# Patient Record
Sex: Female | Born: 1985 | Race: Black or African American | Hispanic: No | Marital: Single | Smoking: Former smoker
Health system: Southern US, Community
[De-identification: ages and names within clinical notes are randomized; demographics above are authoritative.]

## PROBLEM LIST (undated history)

## (undated) DIAGNOSIS — Z789 Other specified health status: Secondary | ICD-10-CM

## (undated) HISTORY — PX: HERNIA REPAIR: SHX51

---

## 2008-12-15 ENCOUNTER — Inpatient Hospital Stay (HOSPITAL_COMMUNITY): Admission: AD | Admit: 2008-12-15 | Discharge: 2008-12-15 | Payer: Self-pay | Admitting: Obstetrics & Gynecology

## 2009-08-09 ENCOUNTER — Inpatient Hospital Stay (HOSPITAL_COMMUNITY): Admission: AD | Admit: 2009-08-09 | Discharge: 2009-08-09 | Payer: Self-pay | Admitting: Obstetrics & Gynecology

## 2010-07-04 ENCOUNTER — Encounter: Payer: Self-pay | Admitting: Obstetrics and Gynecology

## 2010-08-31 LAB — CBC
Hemoglobin: 13.9 g/dL (ref 12.0–15.0)
MCHC: 33.3 g/dL (ref 30.0–36.0)
RBC: 4.28 MIL/uL (ref 3.87–5.11)

## 2010-08-31 LAB — WET PREP, GENITAL: Yeast Wet Prep HPF POC: NONE SEEN

## 2010-08-31 LAB — POCT PREGNANCY, URINE: Preg Test, Ur: NEGATIVE

## 2010-08-31 LAB — SAMPLE TO BLOOD BANK

## 2010-08-31 LAB — GC/CHLAMYDIA PROBE AMP, GENITAL
Chlamydia, DNA Probe: NEGATIVE
GC Probe Amp, Genital: NEGATIVE

## 2010-09-18 LAB — CBC
HCT: 39.9 % (ref 36.0–46.0)
Hemoglobin: 13.9 g/dL (ref 12.0–15.0)
MCHC: 34.8 g/dL (ref 30.0–36.0)
MCV: 96.6 fL (ref 78.0–100.0)
Platelets: 228 10*3/uL (ref 150–400)
RBC: 4.13 MIL/uL (ref 3.87–5.11)
RDW: 13.4 % (ref 11.5–15.5)
WBC: 7.9 10*3/uL (ref 4.0–10.5)

## 2010-09-18 LAB — ABO/RH: ABO/RH(D): B POS

## 2010-09-18 LAB — HCG, QUANTITATIVE, PREGNANCY: hCG, Beta Chain, Quant, S: 13 m[IU]/mL — ABNORMAL HIGH (ref <5)

## 2012-10-09 ENCOUNTER — Encounter (HOSPITAL_COMMUNITY): Payer: Self-pay | Admitting: Family Medicine

## 2012-10-09 ENCOUNTER — Emergency Department (HOSPITAL_COMMUNITY)
Admission: EM | Admit: 2012-10-09 | Discharge: 2012-10-09 | Disposition: A | Discharge status: Home / Self Care | Payer: Self-pay | Attending: Emergency Medicine | Admitting: Emergency Medicine

## 2012-10-09 DIAGNOSIS — N946 Dysmenorrhea, unspecified: Secondary | ICD-10-CM | POA: Insufficient documentation

## 2012-10-09 DIAGNOSIS — F17200 Nicotine dependence, unspecified, uncomplicated: Secondary | ICD-10-CM | POA: Insufficient documentation

## 2012-10-09 DIAGNOSIS — R1110 Vomiting, unspecified: Secondary | ICD-10-CM | POA: Insufficient documentation

## 2012-10-09 DIAGNOSIS — Z3202 Encounter for pregnancy test, result negative: Secondary | ICD-10-CM | POA: Insufficient documentation

## 2012-10-09 LAB — URINALYSIS, ROUTINE W REFLEX MICROSCOPIC
Bilirubin Urine: NEGATIVE
Glucose, UA: NEGATIVE mg/dL
Ketones, ur: NEGATIVE mg/dL
Protein, ur: NEGATIVE mg/dL
pH: 5.5 (ref 5.0–8.0)

## 2012-10-09 LAB — URINE MICROSCOPIC-ADD ON

## 2012-10-09 MED ORDER — HYDROMORPHONE HCL PF 2 MG/ML IJ SOLN
2.0000 mg | Freq: Once | INTRAMUSCULAR | Status: AC
  Administered 2012-10-09: 2 mg via INTRAMUSCULAR
  Filled 2012-10-09: qty 1

## 2012-10-09 MED ORDER — ONDANSETRON 4 MG PO TBDP
ORAL_TABLET | ORAL | Status: AC
  Administered 2012-10-09: 4 mg via ORAL
  Filled 2012-10-09: qty 1

## 2012-10-09 MED ORDER — HYDROCODONE-ACETAMINOPHEN 5-325 MG PO TABS: 2.0000 | ORAL_TABLET | ORAL | Status: DC | PRN

## 2012-10-09 MED ORDER — KETOROLAC TROMETHAMINE 30 MG/ML IJ SOLN
30.0000 mg | Freq: Once | INTRAMUSCULAR | Status: AC
  Administered 2012-10-09: 30 mg via INTRAMUSCULAR
  Filled 2012-10-09: qty 1

## 2012-10-09 MED ORDER — ONDANSETRON 4 MG PO TBDP: 4.0000 mg | ORAL_TABLET | Freq: Three times a day (TID) | ORAL | Status: DC | PRN

## 2012-10-09 MED ORDER — ONDANSETRON 4 MG PO TBDP
4.0000 mg | ORAL_TABLET | Freq: Once | ORAL | Status: AC
  Administered 2012-10-09: 4 mg via ORAL

## 2012-10-09 MED ORDER — IBUPROFEN 800 MG PO TABS: 800.0000 mg | ORAL_TABLET | Freq: Three times a day (TID) | ORAL | Status: DC

## 2012-10-09 MED ORDER — ONDANSETRON HCL 4 MG/2ML IJ SOLN: 4.0000 mg | Freq: Once | INTRAMUSCULAR | Status: DC

## 2012-10-09 NOTE — ED Provider Notes (Signed)
History     CSN: [id number]  Arrival date & time 10/09/12  8295   First MD Initiated Contact with Patient 10/09/12 0848      Chief Complaint  Patient presents with  . Menstrual Problem  . Abdominal Cramping    (Consider location/radiation/quality/duration/timing/severity/associated sxs/prior treatment) Patient is a 27 y.o. female presenting with cramps. The history is provided by the patient. No language interpreter was used.  Abdominal Cramping This is a new problem. The current episode started today. The problem occurs constantly. The problem has been unchanged. Associated symptoms include vomiting. Pertinent negatives include no abdominal pain. Nothing aggravates the symptoms. She has tried nothing for the symptoms. The treatment provided moderate relief.  Pt complains of severe menstrual cramps.  Pt used to see Dr. Neva Seat but she was unable to help symptoms.    History reviewed. No pertinent past medical history.  Past Surgical History  Procedure Laterality Date  . Hernia repair      History reviewed. No pertinent family history.  History  Substance Use Topics  . Smoking status: Current Every Day Smoker  . Smokeless tobacco: Not on file  . Alcohol Use: Yes    OB History   Grav Para Term Preterm Abortions TAB SAB Ect Mult Living                  Review of Systems  Gastrointestinal: Positive for vomiting. Negative for abdominal pain.  All other systems reviewed and are negative.    Allergies  Antihistamines, diphenhydramine-type  Home Medications  No current outpatient prescriptions on file.  BP 130/87  Pulse 61  Temp(Src) 97.8 F (36.6 C) (Oral)  Resp 18  SpO2 95%  LMP 10/09/2012  Physical Exam  Nursing note and vitals reviewed. Constitutional: She appears well-developed and well-nourished.  HENT:  Head: Normocephalic.  Cardiovascular: Normal rate.   Pulmonary/Chest: Effort normal.  Abdominal: Soft. There is no tenderness.  Musculoskeletal:  Normal range of motion.  Neurological: She is alert.  Skin: Skin is warm.    ED Course  Procedures (including critical care time)  Labs Reviewed - No data to display No results found.   No diagnosis found.    MDM  Pt given torodol, zofran and dilaudid,   Pt reports she feels better,   Pt given referal to [facility] hospital gyn clinic,   Pt givern rx for zofran, hydrocodone and ibuprofen.     Lonia Skinner [place], PA-C 10/09/12 1150

## 2012-10-09 NOTE — ED Notes (Signed)
Pt reports period began today, c/o lower back pain and abdominal cramping. Hx of same for years, but unable to control pain at home. Bleeding no heavier than usual. Reports nausea, vomiting as well.

## 2012-10-09 NOTE — ED Notes (Signed)
Per pt sts her cycle started this morning and she is having pelvic pain, cramps, chills, N.V. sts took tylenol for the pain.

## 2012-10-11 LAB — URINE CULTURE: Colony Count: >=100000

## 2012-10-11 NOTE — ED Provider Notes (Signed)
Medical screening examination/treatment/procedure(s) were performed by non-physician practitioner and as supervising physician I was immediately available for consultation/collaboration.   Gwyneth Sprout, MD 10/11/12 1401

## 2012-10-12 ENCOUNTER — Telehealth (HOSPITAL_COMMUNITY): Payer: Self-pay | Admitting: Emergency Medicine

## 2012-10-12 NOTE — ED Notes (Signed)
Patient has +Urine culture. °

## 2012-10-12 NOTE — ED Notes (Signed)
+   Urine Chart sent to EDP office for review. 

## 2012-10-13 ENCOUNTER — Telehealth (HOSPITAL_COMMUNITY): Payer: Self-pay | Admitting: Emergency Medicine

## 2012-10-13 NOTE — ED Notes (Signed)
Chart returned from EDP office. Per [employer] PA-C, Macrobid 100 mg PO BID x 5 days.

## 2012-10-13 NOTE — ED Notes (Signed)
Unable to contact patient via phone. Sent letter. °

## 2012-11-12 ENCOUNTER — Telehealth (HOSPITAL_COMMUNITY): Payer: Self-pay | Admitting: Emergency Medicine

## 2012-11-12 NOTE — ED Notes (Signed)
No response to letter sent after 30 days. Chart sent to Medical Records. °

## 2012-12-04 ENCOUNTER — Emergency Department (HOSPITAL_COMMUNITY)
Admission: EM | Admit: 2012-12-04 | Discharge: 2012-12-04 | Disposition: A | Discharge status: Home / Self Care | Payer: Self-pay | Attending: Emergency Medicine | Admitting: Emergency Medicine

## 2012-12-04 ENCOUNTER — Encounter (HOSPITAL_COMMUNITY): Payer: Self-pay | Admitting: Emergency Medicine

## 2012-12-04 DIAGNOSIS — Z3202 Encounter for pregnancy test, result negative: Secondary | ICD-10-CM | POA: Insufficient documentation

## 2012-12-04 DIAGNOSIS — N946 Dysmenorrhea, unspecified: Secondary | ICD-10-CM | POA: Insufficient documentation

## 2012-12-04 DIAGNOSIS — Z87891 Personal history of nicotine dependence: Secondary | ICD-10-CM | POA: Insufficient documentation

## 2012-12-04 LAB — URINE MICROSCOPIC-ADD ON

## 2012-12-04 LAB — URINALYSIS, ROUTINE W REFLEX MICROSCOPIC
Bilirubin Urine: NEGATIVE
Glucose, UA: 250 mg/dL — AB
Ketones, ur: 15 mg/dL — AB
Leukocytes, UA: NEGATIVE
Nitrite: POSITIVE — AB
Protein, ur: NEGATIVE mg/dL
Specific Gravity, Urine: 1.033 — ABNORMAL HIGH (ref 1.005–1.030)
Urobilinogen, UA: 0.2 mg/dL (ref 0.0–1.0)
pH: 6 (ref 5.0–8.0)

## 2012-12-04 LAB — PREGNANCY, URINE: Preg Test, Ur: NEGATIVE

## 2012-12-04 MED ORDER — ACETAMINOPHEN-CAFF-PYRILAMINE 500-60-15 MG PO TABS: 1.0000 | ORAL_TABLET | Freq: Three times a day (TID) | ORAL | Status: DC | PRN

## 2012-12-04 MED ORDER — PROMETHAZINE HCL 25 MG/ML IJ SOLN
25.0000 mg | Freq: Once | INTRAMUSCULAR | Status: AC
  Administered 2012-12-04: 25 mg via INTRAVENOUS
  Filled 2012-12-04: qty 1

## 2012-12-04 MED ORDER — MORPHINE SULFATE 4 MG/ML IJ SOLN
4.0000 mg | Freq: Once | INTRAMUSCULAR | Status: DC
  Filled 2012-12-04: qty 1

## 2012-12-04 MED ORDER — KETOROLAC TROMETHAMINE 30 MG/ML IJ SOLN
30.0000 mg | Freq: Once | INTRAMUSCULAR | Status: AC
  Administered 2012-12-04: 30 mg via INTRAVENOUS
  Filled 2012-12-04: qty 1

## 2012-12-04 NOTE — ED Notes (Signed)
Pt given warm pack to put on her abd to help with pain.

## 2012-12-04 NOTE — ED Notes (Signed)
Discharge and follow up instructions reviewed. Pt verbalized understanding.  

## 2012-12-04 NOTE — ED Notes (Signed)
Pt reports just starting her menstrual period this AM. States took 200mg  motrin at 0600. Tylenol 325mg  at 0500. Pt moaning in pain. Pt alert, oriented x4.

## 2012-12-04 NOTE — ED Notes (Signed)
Pt presents with a c/o abd pain. Pt states she usually gets bad cramps at the start of her period and nothing helps, she comes to the hospital. Pt states she saw her OBGYN last year and they put her on the depo shot to stop her period but she has been off of the shot for 4-21yrs due to not having insurance and weight gain. Pt rates pain 10/10.

## 2012-12-04 NOTE — ED Provider Notes (Signed)
History    CSN: [id number] Arrival date & time 12/04/12  7253  First MD Initiated Contact with Patient 12/04/12 1118     Chief Complaint  Patient presents with  . Abdominal Cramping   (Consider location/radiation/quality/duration/timing/severity/associated sxs/prior Treatment) HPI Sonya Nguyen is a 27 y.o.female with a significant PMH of dysmenorrhea presents to the ER with complaints of severe pain during menstrual cramping. She has been seen in the ER every 3rd period she says for the same thing. She did go to [facility] after her last evaluation and they recommend her to start taking the Depo Shot. She says that she wants to but is unable to afford it because it is $150 dollars and she does not have insurance. The patient is moaning in pain and has a warm pack on her suprapubic region. Tried 200mg  Ibuprofen at 5am this morning with Tylenol without relief.    History reviewed. No pertinent past medical history. Past Surgical History  Procedure Laterality Date  . Hernia repair     History reviewed. No pertinent family history. History  Substance Use Topics  . Smoking status: Former Smoker    Types: Cigarettes    Quit date: 11/10/2012  . Smokeless tobacco: Not on file  . Alcohol Use: Yes   OB History   Grav Para Term Preterm Abortions TAB SAB Ect Mult Living                 Review of Systems  Genitourinary: Positive for menstrual problem and pelvic pain.  All other systems reviewed and are negative.    Allergies  Antihistamines, diphenhydramine-type  Home Medications   Current Outpatient Rx  Name  Route  Sig  Dispense  Refill  . acetaminophen (TYLENOL) 325 MG tablet   Oral   Take 325-650 mg by mouth every 6 (six) hours as needed for pain.         Marland Kitchen ibuprofen (ADVIL,MOTRIN) 200 MG tablet   Oral   Take 200 mg by mouth every 6 (six) hours as needed for pain.         . Acetaminophen-Caff-Pyrilamine (MIDOL COMPLETE) 500-60-15 MG TABS   Oral   Take 1  tablet by mouth every 8 (eight) hours as needed.   20 each   0    BP 138/87  Pulse 73  Temp(Src) 97.7 F (36.5 C) (Oral)  Resp 16  SpO2 99%  LMP 12/04/2012 Physical Exam  Nursing note and vitals reviewed. Constitutional: She appears well-developed and well-nourished. No distress.  HENT:  Head: Normocephalic and atraumatic.  Eyes: Pupils are equal, round, and reactive to light.  Neck: Normal range of motion. Neck supple.  Cardiovascular: Normal rate and regular rhythm.   Pulmonary/Chest: Effort normal.  Abdominal: Soft. There is tenderness in the suprapubic area. There is no rigidity, no rebound, no guarding and negative Murphy's sign.  Neurological: She is alert.  Skin: Skin is warm and dry.    ED Course  Procedures (including critical care time) Labs Reviewed  URINALYSIS, ROUTINE W REFLEX MICROSCOPIC - Abnormal; Notable for the following:    APPearance HAZY (*)    Specific Gravity, Urine 1.033 (*)    Glucose, UA 250 (*)    Hgb urine dipstick LARGE (*)    Ketones, ur 15 (*)    Nitrite POSITIVE (*)    All other components within normal limits  URINE MICROSCOPIC-ADD ON - Abnormal; Notable for the following:    Bacteria, UA MANY (*)    All other  components within normal limits  URINE CULTURE  PREGNANCY, URINE   No results found. 1. Dysmenorrhea     MDM  Patients pain is an acute on chronic issue. Will treat with IV analgesics but will not give narcotic Rx for home. Urinalysis and urine preg pending.  Urine appears dirty, culture added on before treating. Pain relieved with Toradol and phenergan IV. Rx for midol given.  26 y.o.Sonya Nguyen's evaluation in the Emergency Department is complete. It has been determined that no acute conditions requiring further emergency intervention are present at this time. The patient/guardian have been advised of the diagnosis and plan. We have discussed signs and symptoms that warrant return to the ED, such as changes or worsening  in symptoms.  Vital signs are stable at discharge. Filed Vitals:   12/04/12 1226  BP: 138/87  Pulse: 73  Temp:   Resp: 16    Patient/guardian has voiced understanding and agreed to follow-up with the PCP or specialist.   Dorthula Matas, PA-C 12/04/12 1357

## 2012-12-04 NOTE — ED Provider Notes (Signed)
Medical screening examination/treatment/procedure(s) were performed by non-physician practitioner and as supervising physician I was immediately available for consultation/collaboration.   Glynn Octave, MD 12/04/12 2032

## 2012-12-06 LAB — URINE CULTURE: Colony Count: >=100000

## 2012-12-07 ENCOUNTER — Telehealth (HOSPITAL_COMMUNITY): Payer: Self-pay | Admitting: Emergency Medicine

## 2012-12-07 NOTE — ED Notes (Signed)
Post ED Visit - Positive Culture Follow-up: Successful Patient Follow-Up  Culture assessed and recommendations reviewed by: []  Wes Dulaney, Pharm.D., BCPS []  Celedonio Miyamoto, Pharm.D., BCPS []  Georgina Pillion, Pharm.D., BCPS []  [place], [address].D., BCPS, AAHIVP []  Estella Husk, Pharm.D., BCPS, AAHIVP [x]  Laurence Slate, [address].D., BCPS  Positive urine culture  [x]  Patient discharged without antimicrobial prescription and treatment is now indicated []  Organism is resistant to prescribed ED discharge antimicrobial []  Patient with positive blood cultures  Changes discussed with ED provider: Rhea Bleacher PA-C New antibiotic prescription: If symptomatic, Cephalexin 500 mg PO TID x 7 days    Kylie A Holland 12/07/2012, 2:19 PM

## 2012-12-07 NOTE — Progress Notes (Signed)
ED Antimicrobial Stewardship Positive Culture Follow Up   Sonya Nguyen is an 27 y.o. female who presented to [facility] on 12/04/2012 with a chief complaint of  Chief Complaint  Patient presents with  . Abdominal Cramping    Recent Results (from the past 720 hour(s))  URINE CULTURE     Status: None   Collection Time    12/04/12 12:59 PM      Result Value Range Status   Specimen Description URINE, CLEAN CATCH   Final   Special Requests NONE   Final   Culture  Setup Time 12/04/2012 21:09   Final   Colony Count >=100,000 COLONIES/ML   Final   Culture ESCHERICHIA COLI   Final   Report Status 12/06/2012 FINAL   Final   Organism ID, Bacteria ESCHERICHIA COLI   Final     [x]  Patient discharged originally without antimicrobial agent and treatment is now indicated  New antibiotic prescription: perform symptom check, if having symptoms of dysuria, use cephalexin 500mg  PO TID x 7 days  ED Provider: Rhea Bleacher, PA-C   Laurence Slate 12/07/2012, 12:40 PM Infectious Diseases Pharmacist Phone# [phone]

## 2012-12-08 ENCOUNTER — Telehealth (HOSPITAL_COMMUNITY): Payer: Self-pay | Admitting: Emergency Medicine

## 2013-01-13 NOTE — ED Notes (Signed)
No response after 30 days .Chart closed out and sent to Medical Records.  

## 2013-03-29 ENCOUNTER — Emergency Department (HOSPITAL_COMMUNITY)
Admission: EM | Admit: 2013-03-29 | Discharge: 2013-03-29 | Disposition: A | Discharge status: Home / Self Care | Payer: Self-pay | Attending: Emergency Medicine | Admitting: Emergency Medicine

## 2013-03-29 ENCOUNTER — Encounter (HOSPITAL_COMMUNITY): Payer: Self-pay | Admitting: Emergency Medicine

## 2013-03-29 DIAGNOSIS — R112 Nausea with vomiting, unspecified: Secondary | ICD-10-CM | POA: Insufficient documentation

## 2013-03-29 DIAGNOSIS — Z3202 Encounter for pregnancy test, result negative: Secondary | ICD-10-CM | POA: Insufficient documentation

## 2013-03-29 DIAGNOSIS — Z87891 Personal history of nicotine dependence: Secondary | ICD-10-CM | POA: Insufficient documentation

## 2013-03-29 DIAGNOSIS — N946 Dysmenorrhea, unspecified: Secondary | ICD-10-CM | POA: Insufficient documentation

## 2013-03-29 LAB — URINE MICROSCOPIC-ADD ON

## 2013-03-29 LAB — URINALYSIS, ROUTINE W REFLEX MICROSCOPIC
Glucose, UA: NEGATIVE mg/dL
Specific Gravity, Urine: 1.035 — ABNORMAL HIGH (ref 1.005–1.030)
Urobilinogen, UA: 0.2 mg/dL (ref 0.0–1.0)
pH: 5.5 (ref 5.0–8.0)

## 2013-03-29 MED ORDER — KETOROLAC TROMETHAMINE 30 MG/ML IJ SOLN
30.0000 mg | Freq: Once | INTRAMUSCULAR | Status: AC
  Administered 2013-03-29: 30 mg via INTRAVENOUS
  Filled 2013-03-29: qty 1

## 2013-03-29 MED ORDER — PROMETHAZINE HCL 25 MG/ML IJ SOLN
12.5000 mg | Freq: Once | INTRAMUSCULAR | Status: AC
  Administered 2013-03-29: 12.5 mg via INTRAVENOUS
  Filled 2013-03-29: qty 1

## 2013-03-29 MED ORDER — SODIUM CHLORIDE 0.9 % IV BOLUS (SEPSIS)
500.0000 mL | Freq: Once | INTRAVENOUS | Status: AC
  Administered 2013-03-29: 500 mL via INTRAVENOUS

## 2013-03-29 NOTE — ED Notes (Signed)
To ED via private vehicle-- with c/o severe abd cramping with vaginal bleeding (reg menstrual cycle). States has period cramps like this approx every 3-4 months. Relieved only when in shower, with hot water. Also has nausea and vomiting.

## 2013-03-29 NOTE — ED Provider Notes (Signed)
CSN: [id number]     Arrival date & time 03/29/13  9147 History   First MD Initiated Contact with Patient 03/29/13 [phone]     Chief Complaint  Patient presents with  . Abdominal Cramping   (Consider location/radiation/quality/duration/timing/severity/associated sxs/prior Treatment) Patient is a 27 y.o. female presenting with cramps. The history is provided by the patient.  Abdominal Cramping This is a recurrent problem. Associated symptoms include abdominal pain. Pertinent negatives include no chest pain, no headaches and no shortness of breath.   patient presents with abdominal pain and cramping with her period. She states she's been periods monthly but around every 3-4 months it is bad enough to come to the ER. She states she's had some heavy bleeding. It began this morning. She's had some nausea and vomiting home. She denies possibility of pregnancy. She states that she is trying to get the health department to give her the type of shot, however she has not been able to get there yet. No fevers. The pain is constant. No diarrhea or constipation. No dysuria.  History reviewed. No pertinent past medical history. Past Surgical History  Procedure Laterality Date  . Hernia repair     History reviewed. No pertinent family history. History  Substance Use Topics  . Smoking status: Former Smoker    Types: Cigarettes    Quit date: 11/10/2012  . Smokeless tobacco: Not on file  . Alcohol Use: Yes     Comment: occasionally   OB History   Grav Para Term Preterm Abortions TAB SAB Ect Mult Living                 Review of Systems  Constitutional: Negative for activity change and appetite change.  Eyes: Negative for pain.  Respiratory: Negative for chest tightness and shortness of breath.   Cardiovascular: Negative for chest pain and leg swelling.  Gastrointestinal: Positive for abdominal pain. Negative for nausea, vomiting and diarrhea.  Genitourinary: Positive for vaginal bleeding, menstrual  problem and pelvic pain. Negative for flank pain.  Musculoskeletal: Negative for back pain and neck stiffness.  Skin: Negative for rash.  Neurological: Negative for weakness, numbness and headaches.  Psychiatric/Behavioral: Negative for behavioral problems.    Allergies  Antihistamines, diphenhydramine-type  Home Medications  No current outpatient prescriptions on file. BP 110/56  Pulse 72  Temp(Src) 98.7 F (37.1 C) (Oral)  Resp 14  SpO2 98%  LMP 03/29/2013 Physical Exam  Nursing note and vitals reviewed. Constitutional: She is oriented to person, place, and time. She appears well-developed and well-nourished.  HENT:  Head: Normocephalic and atraumatic.  Eyes: EOM are normal. Pupils are equal, round, and reactive to light.  Neck: Normal range of motion. Neck supple.  Cardiovascular: Normal rate, regular rhythm and normal heart sounds.   No murmur heard. Pulmonary/Chest: Effort normal and breath sounds normal. No respiratory distress. She has no wheezes. She has no rales.  Abdominal: Soft. Bowel sounds are normal. She exhibits no distension. There is no tenderness. There is no rebound and no guarding.  Mild suprapubic tenderness without rebound or guarding. No masses.  Musculoskeletal: Normal range of motion.  Neurological: She is alert and oriented to person, place, and time. No cranial nerve deficit.  Skin: Skin is warm and dry.  Psychiatric: She has a normal mood and affect. Her speech is normal.    ED Course  Procedures (including critical care time) Labs Review Labs Reviewed  URINALYSIS, ROUTINE W REFLEX MICROSCOPIC - Abnormal; Notable for the following:  APPearance CLOUDY (*)    Specific Gravity, Urine 1.035 (*)    Hgb urine dipstick LARGE (*)    All other components within normal limits  PREGNANCY, URINE  URINE MICROSCOPIC-ADD ON   Imaging Review No results found.  EKG Interpretation   None       MDM   1. Dysmenorrhea    Patient presents with  dysmenorrhea. History of same. Feels better after treatment. She is not pregnant. She'll be discharged home    Juliet Rude. Rubin Payor, MD 03/29/13 1220

## 2013-03-29 NOTE — ED Notes (Signed)
Patient is resting comfortably. 

## 2013-03-29 NOTE — ED Notes (Signed)
Patient is still sleeping/resting

## 2013-05-16 ENCOUNTER — Emergency Department (HOSPITAL_COMMUNITY)
Admission: EM | Admit: 2013-05-16 | Discharge: 2013-05-16 | Discharge status: Against Medical Advice | Payer: Self-pay | Attending: Emergency Medicine | Admitting: Emergency Medicine

## 2013-05-16 ENCOUNTER — Encounter (HOSPITAL_COMMUNITY): Payer: Self-pay | Admitting: Emergency Medicine

## 2013-05-16 DIAGNOSIS — N898 Other specified noninflammatory disorders of vagina: Secondary | ICD-10-CM | POA: Insufficient documentation

## 2013-05-16 DIAGNOSIS — Z3202 Encounter for pregnancy test, result negative: Secondary | ICD-10-CM | POA: Insufficient documentation

## 2013-05-16 LAB — URINALYSIS, ROUTINE W REFLEX MICROSCOPIC
Bilirubin Urine: NEGATIVE
Ketones, ur: NEGATIVE mg/dL
Leukocytes, UA: NEGATIVE
Nitrite: NEGATIVE
Specific Gravity, Urine: 1.02 (ref 1.005–1.030)
Urobilinogen, UA: 0.2 mg/dL (ref 0.0–1.0)

## 2013-05-16 LAB — CBC
MCH: 33.2 pg (ref 26.0–34.0)
MCV: 96.7 fL (ref 78.0–100.0)
Platelets: 233 10*3/uL (ref 150–400)
RDW: 13.4 % (ref 11.5–15.5)
WBC: 11.9 10*3/uL — ABNORMAL HIGH (ref 4.0–10.5)

## 2013-05-16 NOTE — ED Notes (Addendum)
Pt c/o vaginal bleeding since 11/07.  States lower abdominal cramping since yesterday with some nausea.  Denies any other s/s.  States no sexual activity since 10/17 when she was dx with bacterial vaginosis.  Pt states this is not a new problem for her.

## 2013-05-16 NOTE — ED Notes (Signed)
No answer, unable to find, not in w/r, entryway, b/r, snack area, h/w or triage

## 2013-05-17 ENCOUNTER — Emergency Department (HOSPITAL_COMMUNITY)
Admission: EM | Admit: 2013-05-17 | Discharge: 2013-05-17 | Disposition: A | Discharge status: Home / Self Care | Payer: Self-pay | Attending: Emergency Medicine | Admitting: Emergency Medicine

## 2013-05-17 ENCOUNTER — Encounter (HOSPITAL_COMMUNITY): Payer: Self-pay | Admitting: Emergency Medicine

## 2013-05-17 DIAGNOSIS — Z87891 Personal history of nicotine dependence: Secondary | ICD-10-CM | POA: Insufficient documentation

## 2013-05-17 DIAGNOSIS — M549 Dorsalgia, unspecified: Secondary | ICD-10-CM | POA: Insufficient documentation

## 2013-05-17 DIAGNOSIS — N946 Dysmenorrhea, unspecified: Secondary | ICD-10-CM | POA: Insufficient documentation

## 2013-05-17 DIAGNOSIS — R112 Nausea with vomiting, unspecified: Secondary | ICD-10-CM | POA: Insufficient documentation

## 2013-05-17 LAB — COMPREHENSIVE METABOLIC PANEL
BUN: 10 mg/dL (ref 6–23)
CO2: 26 mEq/L (ref 19–32)
Calcium: 9.4 mg/dL (ref 8.4–10.5)
Creatinine, Ser: 0.79 mg/dL (ref 0.50–1.10)
GFR calc Af Amer: >90 mL/min (ref >90)
GFR calc non Af Amer: >90 mL/min (ref >90)
Glucose, Bld: 124 mg/dL — ABNORMAL HIGH (ref 70–99)

## 2013-05-17 LAB — CBC
Hemoglobin: 12.6 g/dL (ref 12.0–15.0)
MCH: 33.3 pg (ref 26.0–34.0)
MCV: 96.6 fL (ref 78.0–100.0)
RBC: 3.78 MIL/uL — ABNORMAL LOW (ref 3.87–5.11)

## 2013-05-17 MED ORDER — MEDROXYPROGESTERONE ACETATE 5 MG PO TABS: 10.0000 mg | ORAL_TABLET | Freq: Every day | ORAL | Status: DC

## 2013-05-17 MED ORDER — TRAMADOL HCL 50 MG PO TABS: 50.0000 mg | ORAL_TABLET | Freq: Four times a day (QID) | ORAL | Status: DC | PRN

## 2013-05-17 MED ORDER — KETOROLAC TROMETHAMINE 30 MG/ML IJ SOLN
30.0000 mg | Freq: Once | INTRAMUSCULAR | Status: AC
  Administered 2013-05-17: 30 mg via INTRAVENOUS
  Filled 2013-05-17: qty 1

## 2013-05-17 MED ORDER — PROMETHAZINE HCL 25 MG/ML IJ SOLN
12.5000 mg | Freq: Once | INTRAMUSCULAR | Status: AC
  Administered 2013-05-17: 12.5 mg via INTRAVENOUS
  Filled 2013-05-17: qty 1

## 2013-05-17 NOTE — ED Provider Notes (Signed)
CSN: [id number]     Arrival date & time 05/17/13  0409 History   First MD Initiated Contact with Patient 05/17/13 [phone]     Chief Complaint  Patient presents with  . Abdominal Pain   (Consider location/radiation/quality/duration/timing/severity/associated sxs/prior Treatment) HPI Comments: Patient with history of dysmenorrhea, presents with severe abdominal cramps consistent with menstrual cramps. Patient states that she has been bleeding since November 7. She states that she goes through a pad approximately every 3 hours. Pain is sharp and shooting and radiates to back. She has had occasional episodes of vomiting over the past 24 hours. She's been taking ibuprofen without relief. No fever, shortness of breath, syncope, lightheadedness with standing. She does feel more fatigued. The onset of this condition was acute. The course is constant. Aggravating factors: none. Alleviating factors: none.    Patient is a 27 y.o. female presenting with abdominal pain. The history is provided by the patient and medical records.  Abdominal Pain Associated symptoms: nausea, vaginal bleeding and vomiting   Associated symptoms: no chest pain, no cough, no diarrhea, no dysuria, no fever, no sore throat and no vaginal discharge     History reviewed. No pertinent past medical history. Past Surgical History  Procedure Laterality Date  . Hernia repair     No family history on file. History  Substance Use Topics  . Smoking status: Former Smoker    Types: Cigarettes    Quit date: 11/10/2012  . Smokeless tobacco: Not on file  . Alcohol Use: Yes     Comment: occasionally   OB History   Grav Para Term Preterm Abortions TAB SAB Ect Mult Living                 Review of Systems  Constitutional: Negative for fever.  HENT: Negative for rhinorrhea and sore throat.   Eyes: Negative for redness.  Respiratory: Negative for cough.   Cardiovascular: Negative for chest pain.  Gastrointestinal: Positive for  nausea, vomiting and abdominal pain. Negative for diarrhea.  Genitourinary: Positive for vaginal bleeding and menstrual problem. Negative for dysuria and vaginal discharge.  Musculoskeletal: Positive for back pain. Negative for myalgias.  Skin: Negative for rash.  Neurological: Negative for light-headedness and headaches.    Allergies  Antihistamines, diphenhydramine-type  Home Medications   Current Outpatient Rx  Name  Route  Sig  Dispense  Refill  . acetaminophen (TYLENOL) 500 MG tablet   Oral   Take 500 mg by mouth every 6 (six) hours as needed.         Marland Kitchen ibuprofen (ADVIL,MOTRIN) 200 MG tablet   Oral   Take 400 mg by mouth every 6 (six) hours as needed.          BP 140/97  Pulse 87  Temp(Src) 98.1 F (36.7 C) (Oral)  Resp 18  SpO2 99%  LMP 05/16/2013 Physical Exam  Nursing note and vitals reviewed. Constitutional: She appears well-developed and well-nourished.  HENT:  Head: Normocephalic and atraumatic.  Eyes: Conjunctivae are normal. Right eye exhibits no discharge. Left eye exhibits no discharge.  Neck: Normal range of motion. Neck supple.  Cardiovascular: Normal rate, regular rhythm and normal heart sounds.   Pulmonary/Chest: Effort normal and breath sounds normal. No respiratory distress. She has no wheezes. She has no rales.  Abdominal: Soft. There is tenderness (mild) in the right lower quadrant, suprapubic area and left lower quadrant.  Neurological: She is alert.  Skin: Skin is warm and dry.  Psychiatric: She has a normal mood  and affect.    ED Course  Procedures (including critical care time) Labs Review Labs Reviewed  CBC - Abnormal; Notable for the following:    RBC 3.78 (*)    All other components within normal limits  COMPREHENSIVE METABOLIC PANEL - Abnormal; Notable for the following:    Glucose, Bld 124 (*)    Total Bilirubin <0.1 (*)    All other components within normal limits   Imaging Review No results found.  EKG Interpretation    None      4:47 AM Patient seen and examined. Work-up initiated. Medications ordered.   Vital signs reviewed and are as follows: Filed Vitals:   05/17/13 0416  BP: 140/97  Pulse: 87  Temp: 98.1 F (36.7 C)  Resp: 18   Prior to discharge, patient reports feeling better. She was informed of results. No anemia.   Will d/c to home on course of provera for short-term bleeding control.   She is encouraged to f/u with her GYN or contact [facility] outpatient clinic for f/u.   If symptoms worsen again, she will need to go to [facility] for further eval.   Patient counseled on use of narcotic pain medications. Counseled not to combine these medications with others containing tylenol. Urged not to drink alcohol, drive, or perform any other activities that requires focus while taking these medications. The patient verbalizes understanding and agrees with the plan.  The patient was urged to return to the Emergency Department immediately with worsening of current symptoms, worsening abdominal pain, persistent vomiting, blood noted in stools, fever, or any other concerns. The patient verbalized understanding.    MDM   1. Dysmenorrhea    Patient with painful prolonged periods with heavy bleeding. No significant anemia on exam, labs. Pain controlled. Options for follow-up discussed. No emergent conditions suspected. Pain consistent with previous cramping. Do not suspect more significant pelvic etiology such as appendicitis, torsion, TOA/PID.    Renne Crigler, PA-C 05/18/13 1456

## 2013-05-17 NOTE — ED Notes (Signed)
Patient has hx of endometriosis. Patient began having lower mid abdominal pain that radiates to her backside. Patient describes pain as sharp shooting. x2 emesis episodes in 24 hours.

## 2013-05-19 NOTE — ED Provider Notes (Signed)
Medical screening examination/treatment/procedure(s) were performed by non-physician practitioner and as supervising physician I was immediately available for consultation/collaboration.    Sunnie Nielsen, MD 05/19/13 Moses Manners

## 2013-05-27 ENCOUNTER — Encounter (HOSPITAL_COMMUNITY): Payer: Self-pay | Admitting: Emergency Medicine

## 2013-05-27 ENCOUNTER — Emergency Department (HOSPITAL_COMMUNITY)
Admission: EM | Admit: 2013-05-27 | Discharge: 2013-05-27 | Disposition: A | Discharge status: Home / Self Care | Payer: Self-pay | Attending: Emergency Medicine | Admitting: Emergency Medicine

## 2013-05-27 DIAGNOSIS — N760 Acute vaginitis: Secondary | ICD-10-CM | POA: Insufficient documentation

## 2013-05-27 DIAGNOSIS — Z3202 Encounter for pregnancy test, result negative: Secondary | ICD-10-CM | POA: Insufficient documentation

## 2013-05-27 DIAGNOSIS — B9689 Other specified bacterial agents as the cause of diseases classified elsewhere: Secondary | ICD-10-CM | POA: Insufficient documentation

## 2013-05-27 DIAGNOSIS — A499 Bacterial infection, unspecified: Secondary | ICD-10-CM | POA: Insufficient documentation

## 2013-05-27 DIAGNOSIS — Z87891 Personal history of nicotine dependence: Secondary | ICD-10-CM | POA: Insufficient documentation

## 2013-05-27 DIAGNOSIS — Z202 Contact with and (suspected) exposure to infections with a predominantly sexual mode of transmission: Secondary | ICD-10-CM | POA: Insufficient documentation

## 2013-05-27 LAB — POCT PREGNANCY, URINE: Preg Test, Ur: NEGATIVE

## 2013-05-27 LAB — WET PREP, GENITAL: Yeast Wet Prep HPF POC: NONE SEEN

## 2013-05-27 LAB — URINALYSIS, ROUTINE W REFLEX MICROSCOPIC
Bilirubin Urine: NEGATIVE
Nitrite: NEGATIVE
Specific Gravity, Urine: 1.015 (ref 1.005–1.030)
Urobilinogen, UA: 0.2 mg/dL (ref 0.0–1.0)

## 2013-05-27 MED ORDER — AZITHROMYCIN 1 G PO PACK
1.0000 g | PACK | Freq: Once | ORAL | Status: AC
  Administered 2013-05-27: 1 g via ORAL
  Filled 2013-05-27: qty 1

## 2013-05-27 MED ORDER — METRONIDAZOLE 500 MG PO TABS: 500.0000 mg | ORAL_TABLET | Freq: Two times a day (BID) | ORAL | Status: DC

## 2013-05-27 MED ORDER — CEFTRIAXONE SODIUM 250 MG IJ SOLR
250.0000 mg | Freq: Once | INTRAMUSCULAR | Status: AC
  Administered 2013-05-27: 250 mg via INTRAMUSCULAR
  Filled 2013-05-27: qty 250

## 2013-05-27 MED ORDER — ONDANSETRON 4 MG PO TBDP
4.0000 mg | ORAL_TABLET | Freq: Once | ORAL | Status: AC
  Administered 2013-05-27: 4 mg via ORAL
  Filled 2013-05-27: qty 1

## 2013-05-27 NOTE — ED Notes (Addendum)
Explained patient importance of sustaining from sexual intercourse until results.

## 2013-05-27 NOTE — ED Provider Notes (Signed)
CSN: [id number]     Arrival date & time 05/27/13  1330 History   First MD Initiated Contact with Patient 05/27/13 1709     Chief Complaint  Patient presents with  . Exposure to STD  . Vaginal Discharge   (Consider location/radiation/quality/duration/timing/severity/associated sxs/prior Treatment) Patient is a 27 y.o. female presenting with STD exposure and vaginal discharge.  Exposure to STD Pertinent negatives include no abdominal pain, chest pain, chills, congestion, coughing, fever, headaches, nausea, neck pain, rash or vomiting.  Vaginal Discharge Associated symptoms: dysuria   Associated symptoms: no abdominal pain, no fever, no nausea and no vomiting    Sonya Nguyen is a 27 y.o. female who presents to the emergency department concerned that she may have a sexually transmitted disease.  She reports that approximately 6 weeks she had continuous vaginal for roughly one month.  Patient was evaluated here roughly 10 days ago and was given OCP prescription.  She did not fill this prescription but luckily the bleeding stopped.  Then over last two days she has noted some creamy white vaginal discharge that was associated with a bad odor.  Associated with some dysuria.  Then, today she found out that a committed boyfriend has been treated for gonorrhea/chlamydia.  At this point she was concerned and came in.  No abdominal pain.  No fevers.  No hematuria.  No other symptoms.  History reviewed. No pertinent past medical history. Past Surgical History  Procedure Laterality Date  . Hernia repair     History reviewed. No pertinent family history. History  Substance Use Topics  . Smoking status: Former Smoker    Types: Cigarettes    Quit date: 11/10/2012  . Smokeless tobacco: Not on file  . Alcohol Use: Yes     Comment: occasionally   OB History   Grav Para Term Preterm Abortions TAB SAB Ect Mult Living                 Review of Systems  Constitutional: Negative for fever and  chills.  HENT: Negative for congestion and rhinorrhea.   Respiratory: Negative for cough and shortness of breath.   Cardiovascular: Negative for chest pain.  Gastrointestinal: Negative for nausea, vomiting, abdominal pain, diarrhea and abdominal distention.  Endocrine: Negative for polyuria.  Genitourinary: Positive for dysuria and vaginal discharge.  Musculoskeletal: Negative for neck pain and neck stiffness.  Skin: Negative for rash.  Neurological: Negative for headaches.  Psychiatric/Behavioral: Negative.     Allergies  Antihistamines, diphenhydramine-type  Home Medications  No current outpatient prescriptions on file. BP 128/94  Pulse 72  Temp(Src) 98.9 F (37.2 C) (Oral)  Resp 19  Wt 110 lb (49.896 kg)  SpO2 99%  LMP 05/16/2013 Physical Exam  Nursing note and vitals reviewed. Constitutional: She is oriented to person, place, and time. She appears well-developed and well-nourished. No distress.  HENT:  Head: Normocephalic and atraumatic.  Right Ear: External ear normal.  Left Ear: External ear normal.  Nose: Nose normal.  Mouth/Throat: Oropharynx is clear and moist. No oropharyngeal exudate.  Eyes: EOM are normal. Pupils are equal, round, and reactive to light.  Neck: Normal range of motion. Neck supple. No tracheal deviation present.  Cardiovascular: Normal rate.   Pulmonary/Chest: Effort normal and breath sounds normal. No stridor. No respiratory distress. She has no wheezes. She has no rales.  Abdominal: Soft. She exhibits no distension. There is no tenderness. There is no rebound.  Genitourinary: There is no tenderness on the right labia. There is no  tenderness on the left labia. Right adnexum displays no tenderness and no fullness. Left adnexum displays no tenderness and no fullness. Vaginal discharge (yellow) found.  Musculoskeletal: Normal range of motion.  Neurological: She is alert and oriented to person, place, and time.  Skin: Skin is warm and dry. She is not  diaphoretic.    ED Course  Procedures (including critical care time) Labs Review Labs Reviewed  WET PREP, GENITAL  GC/CHLAMYDIA PROBE AMP  URINALYSIS, ROUTINE W REFLEX MICROSCOPIC  POCT PREGNANCY, URINE   Imaging Review No results found.  EKG Interpretation   None       MDM   1. Bacterial vaginosis   2. Exposure to sexually transmitted disease (STD)     Sonya Nguyen is a 27 y.o. female who presents to the ED with concern that she may have an STD.  Exam notable for some yellow vaginal discharge.  Wet prep with BV.  Patient desiring empiric antibiotic treatment for gonnorhea/chlamydia.  Rocephin/azithro given here.  Patient discharged on Flagyl.  All questions answered.  Patient discharged.    Sonya Koh, MD 05/28/13 [phone]

## 2013-05-27 NOTE — ED Notes (Signed)
Pt sts sexual partner being treated for gonorrhea and pt sts vaginal discharge with odor; pt sts LMP was 12/14

## 2013-05-27 NOTE — ED Notes (Signed)
Pt reports creamy vaginal discharge and pain with urination. Boyfriend recently dx and treated for gonorrhea. Pt wanting to be tested and treated.

## 2013-05-28 LAB — GC/CHLAMYDIA PROBE AMP: CT Probe RNA: NEGATIVE

## 2013-05-28 NOTE — ED Provider Notes (Signed)
Medical screening examination/treatment/procedure(s) were conducted as a shared visit with resident physician and myself.  I personally evaluated the patient during the encounter.  I interviewed and examined the patient. Lungs are CTAB. Cardiac exam wnl. Abdomen soft.  Will tx empirically for STD's at pt's request.   Junius Argyle, MD 05/28/13 [phone]

## 2013-05-29 ENCOUNTER — Telehealth (HOSPITAL_COMMUNITY): Payer: Self-pay | Admitting: *Deleted

## 2013-05-29 NOTE — ED Notes (Signed)
+  gonorrhea Patient treated with Rocephin And Zithromax-[facility] faxed 

## 2013-05-29 NOTE — ED Notes (Signed)
Patient informed of positive results after id'd x 2 and informed of need to notify partner to be treated. 

## 2014-07-18 ENCOUNTER — Emergency Department (HOSPITAL_COMMUNITY)
Admission: EM | Admit: 2014-07-18 | Discharge: 2014-07-18 | Disposition: A | Discharge status: Home / Self Care | Payer: Self-pay | Attending: Emergency Medicine | Admitting: Emergency Medicine

## 2014-07-18 ENCOUNTER — Encounter (HOSPITAL_COMMUNITY): Payer: Self-pay | Admitting: *Deleted

## 2014-07-18 DIAGNOSIS — Z3202 Encounter for pregnancy test, result negative: Secondary | ICD-10-CM | POA: Insufficient documentation

## 2014-07-18 DIAGNOSIS — Z87891 Personal history of nicotine dependence: Secondary | ICD-10-CM | POA: Insufficient documentation

## 2014-07-18 DIAGNOSIS — R102 Pelvic and perineal pain: Secondary | ICD-10-CM | POA: Insufficient documentation

## 2014-07-18 DIAGNOSIS — R112 Nausea with vomiting, unspecified: Secondary | ICD-10-CM | POA: Insufficient documentation

## 2014-07-18 LAB — CBC WITH DIFFERENTIAL/PLATELET
BASOS PCT: 0 % (ref 0–1)
Basophils Absolute: 0 10*3/uL (ref 0.0–0.1)
EOS ABS: 0.2 10*3/uL (ref 0.0–0.7)
EOS PCT: 1 % (ref 0–5)
HCT: 39.9 % (ref 36.0–46.0)
HEMOGLOBIN: 13.4 g/dL (ref 12.0–15.0)
Lymphocytes Relative: 13 % (ref 12–46)
Lymphs Abs: 2.4 10*3/uL (ref 0.7–4.0)
MCH: 32.4 pg (ref 26.0–34.0)
MCHC: 33.6 g/dL (ref 30.0–36.0)
MCV: 96.4 fL (ref 78.0–100.0)
MONOS PCT: 8 % (ref 3–12)
Monocytes Absolute: 1.5 10*3/uL — ABNORMAL HIGH (ref 0.1–1.0)
Neutro Abs: 14.7 10*3/uL — ABNORMAL HIGH (ref 1.7–7.7)
Neutrophils Relative %: 78 % — ABNORMAL HIGH (ref 43–77)
PLATELETS: 296 10*3/uL (ref 150–400)
RBC: 4.14 MIL/uL (ref 3.87–5.11)
RDW: 13.2 % (ref 11.5–15.5)
WBC: 18.9 10*3/uL — ABNORMAL HIGH (ref 4.0–10.5)

## 2014-07-18 LAB — URINALYSIS, ROUTINE W REFLEX MICROSCOPIC
BILIRUBIN URINE: NEGATIVE
GLUCOSE, UA: NEGATIVE mg/dL
KETONES UR: NEGATIVE mg/dL
LEUKOCYTES UA: NEGATIVE
Nitrite: NEGATIVE
PH: 5 (ref 5.0–8.0)
Protein, ur: NEGATIVE mg/dL
Specific Gravity, Urine: 1.022 (ref 1.005–1.030)
UROBILINOGEN UA: 0.2 mg/dL (ref 0.0–1.0)

## 2014-07-18 LAB — COMPREHENSIVE METABOLIC PANEL
ALK PHOS: 45 U/L (ref 39–117)
ALT: 26 U/L (ref 0–35)
ANION GAP: 6 (ref 5–15)
AST: 28 U/L (ref 0–37)
Albumin: 4 g/dL (ref 3.5–5.2)
BUN: 10 mg/dL (ref 6–23)
CO2: 28 mmol/L (ref 19–32)
Calcium: 9 mg/dL (ref 8.4–10.5)
Chloride: 106 mmol/L (ref 96–112)
Creatinine, Ser: 0.79 mg/dL (ref 0.50–1.10)
Glucose, Bld: 181 mg/dL — ABNORMAL HIGH (ref 70–99)
POTASSIUM: 3.4 mmol/L — AB (ref 3.5–5.1)
Sodium: 140 mmol/L (ref 135–145)
Total Bilirubin: 0.6 mg/dL (ref 0.3–1.2)
Total Protein: 6.6 g/dL (ref 6.0–8.3)

## 2014-07-18 LAB — WET PREP, GENITAL
Clue Cells Wet Prep HPF POC: NONE SEEN
Trich, Wet Prep: NONE SEEN
WBC, Wet Prep HPF POC: NONE SEEN
Yeast Wet Prep HPF POC: NONE SEEN

## 2014-07-18 LAB — LIPASE, BLOOD: LIPASE: 25 U/L (ref 11–59)

## 2014-07-18 LAB — URINE MICROSCOPIC-ADD ON

## 2014-07-18 LAB — PREGNANCY, URINE: PREG TEST UR: NEGATIVE

## 2014-07-18 MED ORDER — SODIUM CHLORIDE 0.9 % IV BOLUS (SEPSIS)
1000.0000 mL | Freq: Once | INTRAVENOUS | Status: AC
  Administered 2014-07-18: 1000 mL via INTRAVENOUS

## 2014-07-18 MED ORDER — ONDANSETRON HCL 4 MG/2ML IJ SOLN
4.0000 mg | Freq: Once | INTRAMUSCULAR | Status: AC
  Administered 2014-07-18: 4 mg via INTRAVENOUS
  Filled 2014-07-18: qty 2

## 2014-07-18 MED ORDER — HYDROMORPHONE HCL 1 MG/ML IJ SOLN
1.0000 mg | Freq: Once | INTRAMUSCULAR | Status: AC
  Administered 2014-07-18: 1 mg via INTRAVENOUS
  Filled 2014-07-18: qty 1

## 2014-07-18 MED ORDER — HYDROCODONE-ACETAMINOPHEN 5-325 MG PO TABS: 2.0000 | ORAL_TABLET | ORAL | Status: DC | PRN

## 2014-07-18 MED ORDER — ONDANSETRON 4 MG PO TBDP
8.0000 mg | ORAL_TABLET | Freq: Once | ORAL | Status: AC
  Administered 2014-07-18: 8 mg via ORAL
  Filled 2014-07-18: qty 2

## 2014-07-18 MED ORDER — OXYCODONE-ACETAMINOPHEN 5-325 MG PO TABS
1.0000 | ORAL_TABLET | Freq: Once | ORAL | Status: AC
  Administered 2014-07-18: 1 via ORAL
  Filled 2014-07-18: qty 1

## 2014-07-18 NOTE — ED Provider Notes (Signed)
CSN: [id number]     Arrival date & time 07/18/14  1615 History   First MD Initiated Contact with Patient 07/18/14 1759     Chief Complaint  Patient presents with  . Abdominal Pain     (Consider location/radiation/quality/duration/timing/severity/associated sxs/prior Treatment) HPI Comments: Patient presents to the ER for evaluation of lower abdominal pain with nausea and vomiting. Patient reports that she noticed lower abdominal and pelvic pain upon awakening this morning. Pain has intensified and has been constant. She reports that she has been experiencing nausea and vomiting with the symptoms. Patient states that she has had identical symptoms many times in the past with endometriosis.  Patient is a 29 y.o. female presenting with abdominal pain.  Abdominal Pain Associated symptoms: nausea and vomiting     History reviewed. No pertinent past medical history. Past Surgical History  Procedure Laterality Date  . Hernia repair     No family history on file. History  Substance Use Topics  . Smoking status: Former Smoker    Types: Cigarettes    Quit date: 11/10/2012  . Smokeless tobacco: Not on file  . Alcohol Use: Yes     Comment: occasionally   OB History    No data available     Review of Systems  Gastrointestinal: Positive for nausea, vomiting and abdominal pain.  Genitourinary: Positive for pelvic pain.  All other systems reviewed and are negative.     Allergies  Antihistamines, diphenhydramine-type  Home Medications   Prior to Admission medications   Medication Sig Start Date End Date Taking? Authorizing Provider  metroNIDAZOLE (FLAGYL) 500 MG tablet Take 1 tablet (500 mg total) by mouth 2 (two) times daily. One po bid x 7 days Patient not taking: Reported on 07/18/2014 05/27/13   Wiley Thuet, MD   BP 120/76 mmHg  Pulse 72  Temp(Src) 98 F (36.7 C) (Oral)  Resp 20  SpO2 99%  LMP 07/18/2014 Physical Exam  Constitutional: She is oriented to person, place,  and time. She appears well-developed and well-nourished. No distress.  HENT:  Head: Normocephalic and atraumatic.  Right Ear: Hearing normal.  Left Ear: Hearing normal.  Nose: Nose normal.  Mouth/Throat: Oropharynx is clear and moist and mucous membranes are normal.  Eyes: Conjunctivae and EOM are normal. Pupils are equal, round, and reactive to light.  Neck: Normal range of motion. Neck supple.  Cardiovascular: Regular rhythm, S1 normal and S2 normal.  Exam reveals no gallop and no friction rub.   No murmur heard. Pulmonary/Chest: Effort normal and breath sounds normal. No respiratory distress. She exhibits no tenderness.  Abdominal: Soft. Normal appearance and bowel sounds are normal. There is no hepatosplenomegaly. There is tenderness in the suprapubic area. There is no rebound, no guarding, no tenderness at McBurney's point and negative Murphy's sign. No hernia. Hernia confirmed negative in the right inguinal area and confirmed negative in the left inguinal area.  Genitourinary: Uterus normal. There is no rash, tenderness or lesion on the right labia. There is no rash, tenderness or lesion on the left labia. Cervix exhibits no motion tenderness and no discharge. Right adnexum displays no mass and no tenderness. Left adnexum displays no mass and no tenderness. No erythema in the vagina. No signs of injury around the vagina. No vaginal discharge found.  Musculoskeletal: Normal range of motion.  Lymphadenopathy:       Right: No inguinal adenopathy present.       Left: No inguinal adenopathy present.  Neurological: She is alert and oriented  to person, place, and time. She has normal strength. No cranial nerve deficit or sensory deficit. Coordination normal. GCS eye subscore is 4. GCS verbal subscore is 5. GCS motor subscore is 6.  Skin: Skin is warm, dry and intact. No rash noted. No cyanosis.  Psychiatric: She has a normal mood and affect. Her speech is normal and behavior is normal. Thought  content normal.  Nursing note and vitals reviewed.   ED Course  Procedures (including critical care time) Labs Review Labs Reviewed  CBC WITH DIFFERENTIAL/PLATELET - Abnormal; Notable for the following:    WBC 18.9 (*)    Neutrophils Relative % 78 (*)    Neutro Abs 14.7 (*)    Monocytes Absolute 1.5 (*)    All other components within normal limits  COMPREHENSIVE METABOLIC PANEL - Abnormal; Notable for the following:    Potassium 3.4 (*)    Glucose, Bld 181 (*)    All other components within normal limits  URINALYSIS, ROUTINE W REFLEX MICROSCOPIC - Abnormal; Notable for the following:    Hgb urine dipstick LARGE (*)    All other components within normal limits  WET PREP, GENITAL  LIPASE, BLOOD  PREGNANCY, URINE  URINE MICROSCOPIC-ADD ON  RPR  HIV ANTIBODY (ROUTINE TESTING)  GC/CHLAMYDIA PROBE AMP ([place])    Imaging Review No results found.   EKG Interpretation None      MDM   Final diagnoses:  Pelvic pain in female    Patient presents to the ER for evaluation of abdominal and pelvic pain. Pain is mainly in the low pelvis, central region. Patient reports that she has had similar symptoms in the past with endometriosis. She did start menstruating. Abdominal exam was otherwise benign. Lab work was normal except for elevated white blood cell count. This is nonspecific. No concern for bacterial infection. No concern for PID based on pelvic exam. Patient does not require CT scan or ultrasound at this point, as exam is benign. She has had significant relief with fluids and pain medication. Patient will be discharged with analgesia, follow up with OB/GYN.    Orpah Greek, MD 07/18/14 [phone]

## 2014-07-18 NOTE — Discharge Instructions (Signed)
Pelvic Pain Female pelvic pain can be caused by many different things and start from a variety of places. Pelvic pain refers to pain that is located in the lower half of the abdomen and between your hips. The pain may occur over a short period of time (acute) or may be reoccurring (chronic). The cause of pelvic pain may be related to disorders affecting the female reproductive organs (gynecologic), but it may also be related to the bladder, kidney stones, an intestinal complication, or muscle or skeletal problems. Getting help right away for pelvic pain is important, especially if there has been severe, sharp, or a sudden onset of unusual pain. It is also important to get help right away because some types of pelvic pain can be life threatening.  CAUSES  Below are only some of the causes of pelvic pain. The causes of pelvic pain can be in one of several categories.   Gynecologic.  Pelvic inflammatory disease.  Sexually transmitted infection.  Ovarian cyst or a twisted ovarian ligament (ovarian torsion).  Uterine lining that grows outside the uterus (endometriosis).  Fibroids, cysts, or tumors.  Ovulation.  Pregnancy.  Pregnancy that occurs outside the uterus (ectopic pregnancy).  Miscarriage.  Labor.  Abruption of the placenta or ruptured uterus.  Infection.  Uterine infection (endometritis).  Bladder infection.  Diverticulitis.  Miscarriage related to a uterine infection (septic abortion).  Bladder.  Inflammation of the bladder (cystitis).  Kidney stone(s).  Gastrointestinal.  Constipation.  Diverticulitis.  Neurologic.  Trauma.  Feeling pelvic pain because of mental or emotional causes (psychosomatic).  Cancers of the bowel or pelvis. EVALUATION  Your caregiver will want to take a careful history of your concerns. This includes recent changes in your health, a careful gynecologic history of your periods (menses), and a sexual history. Obtaining your family  history and medical history is also important. Your caregiver may suggest a pelvic exam. A pelvic exam will help identify the location and severity of the pain. It also helps in the evaluation of which organ system may be involved. In order to identify the cause of the pelvic pain and be properly treated, your caregiver may order tests. These tests may include:   A pregnancy test.  Pelvic ultrasonography.  An X-ray exam of the abdomen.  A urinalysis or evaluation of vaginal discharge.  Blood tests. HOME CARE INSTRUCTIONS   Only take over-the-counter or prescription medicines for pain, discomfort, or fever as directed by your caregiver.   Rest as directed by your caregiver.   Eat a balanced diet.   Drink enough fluids to make your urine clear or pale yellow, or as directed.   Avoid sexual intercourse if it causes pain.   Apply warm or cold compresses to the lower abdomen depending on which one helps the pain.   Avoid stressful situations.   Keep a journal of your pelvic pain. Write down when it started, where the pain is located, and if there are things that seem to be associated with the pain, such as food or your menstrual cycle.  Follow up with your caregiver as directed.  SEEK MEDICAL CARE IF:  Your medicine does not help your pain.  You have abnormal vaginal discharge. SEEK IMMEDIATE MEDICAL CARE IF:   You have heavy bleeding from the vagina.   Your pelvic pain increases.   You feel light-headed or faint.   You have chills.   You have pain with urination or blood in your urine.   You have uncontrolled diarrhea   or vomiting.   You have a fever or persistent symptoms for more than 3 days.  You have a fever and your symptoms suddenly get worse.   You are being physically or sexually abused.  MAKE SURE YOU:  Understand these instructions.  Will watch your condition.  Will get help if you are not doing well or get worse. Document Released:  04/25/2004 Document Revised: 10/13/2013 Document Reviewed: 09/18/2011 Exit[employer] Patient Information 2015 [employer]. This information is not intended to replace advice given to you by your health care provider. Make sure you discuss any questions you have with your health care provider.  

## 2014-07-18 NOTE — ED Notes (Signed)
The pt reports that she cannot void at present

## 2014-07-18 NOTE — ED Notes (Signed)
The pt is c/o abd pain today  When her period started.  She has this each period and she reports that she comes to the ed for morphine  lmp now

## 2014-07-18 NOTE — ED Notes (Signed)
The pt is not making any tears with her crying.

## 2014-07-18 NOTE — ED Notes (Signed)
Pt crying in triage

## 2014-07-20 LAB — GC/CHLAMYDIA PROBE AMP (~~LOC~~) NOT AT ARMC
Chlamydia: NEGATIVE
Neisseria Gonorrhea: NEGATIVE

## 2014-07-20 LAB — HIV ANTIBODY (ROUTINE TESTING W REFLEX): HIV Screen 4th Generation wRfx: NONREACTIVE

## 2014-07-20 LAB — RPR: RPR Ser Ql: NONREACTIVE

## 2015-01-03 ENCOUNTER — Encounter (HOSPITAL_COMMUNITY): Payer: Self-pay | Admitting: Emergency Medicine

## 2015-01-03 ENCOUNTER — Emergency Department (HOSPITAL_COMMUNITY)
Admission: EM | Admit: 2015-01-03 | Discharge: 2015-01-03 | Disposition: A | Discharge status: Home / Self Care | Payer: Self-pay | Attending: Emergency Medicine | Admitting: Emergency Medicine

## 2015-01-03 DIAGNOSIS — Z3202 Encounter for pregnancy test, result negative: Secondary | ICD-10-CM | POA: Insufficient documentation

## 2015-01-03 DIAGNOSIS — Z87891 Personal history of nicotine dependence: Secondary | ICD-10-CM | POA: Insufficient documentation

## 2015-01-03 DIAGNOSIS — N946 Dysmenorrhea, unspecified: Secondary | ICD-10-CM | POA: Insufficient documentation

## 2015-01-03 LAB — URINALYSIS, ROUTINE W REFLEX MICROSCOPIC
Glucose, UA: NEGATIVE mg/dL
Ketones, ur: 40 mg/dL — AB
Nitrite: NEGATIVE
PROTEIN: 30 mg/dL — AB
Specific Gravity, Urine: 1.029 (ref 1.005–1.030)
UROBILINOGEN UA: 0.2 mg/dL (ref 0.0–1.0)
pH: 5 (ref 5.0–8.0)

## 2015-01-03 LAB — URINE MICROSCOPIC-ADD ON

## 2015-01-03 LAB — POC URINE PREG, ED: Preg Test, Ur: NEGATIVE

## 2015-01-03 MED ORDER — ONDANSETRON 4 MG PO TBDP
ORAL_TABLET | ORAL | Status: AC
  Filled 2015-01-03: qty 1

## 2015-01-03 MED ORDER — ONDANSETRON 4 MG PO TBDP: 8.0000 mg | ORAL_TABLET | Freq: Once | ORAL | Status: DC

## 2015-01-03 MED ORDER — ONDANSETRON 4 MG PO TBDP
4.0000 mg | ORAL_TABLET | Freq: Once | ORAL | Status: AC | PRN
  Administered 2015-01-03: 4 mg via ORAL

## 2015-01-03 MED ORDER — IBUPROFEN 600 MG PO TABS: 600.0000 mg | ORAL_TABLET | Freq: Three times a day (TID) | ORAL | Status: DC | PRN

## 2015-01-03 MED ORDER — OXYCODONE-ACETAMINOPHEN 5-325 MG PO TABS
ORAL_TABLET | ORAL | Status: AC
  Filled 2015-01-03: qty 1

## 2015-01-03 MED ORDER — OXYCODONE-ACETAMINOPHEN 5-325 MG PO TABS
1.0000 | ORAL_TABLET | Freq: Once | ORAL | Status: AC
  Administered 2015-01-03: 1 via ORAL

## 2015-01-03 MED ORDER — IBUPROFEN 800 MG PO TABS: 800.0000 mg | ORAL_TABLET | Freq: Once | ORAL | Status: DC

## 2015-01-03 MED ORDER — KETOROLAC TROMETHAMINE 60 MG/2ML IM SOLN
60.0000 mg | Freq: Once | INTRAMUSCULAR | Status: AC
Start: 2015-01-03 — End: 2015-01-03
  Administered 2015-01-03: 60 mg via INTRAMUSCULAR
  Filled 2015-01-03: qty 2

## 2015-01-03 NOTE — ED Notes (Signed)
Pt refusing tylenol at this time states "that does not work." PA notified, see MAR for toradol order.

## 2015-01-03 NOTE — ED Provider Notes (Signed)
CSN: [id number]     Arrival date & time 01/03/15  1425 History   First MD Initiated Contact with Patient 01/03/15 1615     Chief Complaint  Patient presents with  . Abdominal Pain     (Consider location/radiation/quality/duration/timing/severity/associated sxs/prior Treatment) HPI Comments: 29 year old female presenting with severe menstrual cramps beginning this morning. Her menstrual cycle began morning and was 2 days late. Describes the cramping as dull, constant rated 10/10, radiating towards her back and into her "cooty cat". States this feels the same as her typical menstrual cramps just a little more intense. Reports a long history of painful menstrual cycles and was previously diagnosed with endometriosis. Has not seen her gynecologist in a while due to insurance reasons. Admits to associated nausea with 3-4 episodes of watery emesis. Denies fevers. No diarrhea. Denies vaginal discharge.  Patient is a 29 y.o. female presenting with abdominal pain. The history is provided by the patient.  Abdominal Pain Associated symptoms: nausea, vaginal bleeding (menstrual) and vomiting     History reviewed. No pertinent past medical history. Past Surgical History  Procedure Laterality Date  . Hernia repair     History reviewed. No pertinent family history. History  Substance Use Topics  . Smoking status: Former Smoker    Types: Cigarettes    Quit date: 11/10/2012  . Smokeless tobacco: Not on file  . Alcohol Use: Yes     Comment: occasionally   OB History    No data available     Review of Systems  Gastrointestinal: Positive for nausea, vomiting and abdominal pain.  Genitourinary: Positive for vaginal bleeding (menstrual).  All other systems reviewed and are negative.     Allergies  Antihistamines, diphenhydramine-type  Home Medications   Prior to Admission medications   Medication Sig Start Date End Date Taking? Authorizing Provider  ibuprofen (ADVIL,MOTRIN) 600 MG tablet  Take 1 tablet (600 mg total) by mouth every 8 (eight) hours as needed. 01/03/15   Dshawn Mcnay M Winford Hehn, PA-C   BP 128/73 mmHg  Pulse 63  Temp(Src) 97.6 F (36.4 C) (Oral)  Resp 18  SpO2 96% Physical Exam  Constitutional: She is oriented to person, place, and time. She appears well-developed and well-nourished. No distress.  HENT:  Head: Normocephalic and atraumatic.  Mouth/Throat: Oropharynx is clear and moist.  Eyes: Conjunctivae and EOM are normal.  Neck: Normal range of motion. Neck supple.  Cardiovascular: Normal rate, regular rhythm and normal heart sounds.   Pulmonary/Chest: Effort normal and breath sounds normal. No respiratory distress.  Abdominal: Normal appearance and bowel sounds are normal. She exhibits no distension. There is tenderness (generalized, moreso suprapubic). There is no rigidity, no rebound and no guarding.  No signs of pain with palpation when distracted.  Musculoskeletal: Normal range of motion. She exhibits no edema.  Neurological: She is alert and oriented to person, place, and time. No sensory deficit.  Skin: Skin is warm and dry.  Psychiatric: She has a normal mood and affect. Her behavior is normal.  Nursing note and vitals reviewed.   ED Course  Procedures (including critical care time) Labs Review Labs Reviewed  URINALYSIS, ROUTINE W REFLEX MICROSCOPIC (NOT AT [facility]) - Abnormal; Notable for the following:    Color, Urine RED (*)    APPearance TURBID (*)    Hgb urine dipstick LARGE (*)    Bilirubin Urine SMALL (*)    Ketones, ur 40 (*)    Protein, ur 30 (*)    Leukocytes, UA SMALL (*)  All other components within normal limits  URINE MICROSCOPIC-ADD ON  POC URINE PREG, ED    Imaging Review No results found.   EKG Interpretation None      MDM   Final diagnoses:  Dysmenorrhea   Nontoxic appearing, NAD. AF VSS. Abdomen is soft with no peritoneal signs. No signs of pain with palpation when distracted. Hx of the same. She was given a  Percocet on arrival and stated "that don't help". Patient refusing both Tylenol and ibuprofen. Given a shot of Toradol with complete relief of patient's symptoms. On repeat exam, abdomen soft and nontender. Tolerating PO. Advised the patient to follow-up with gynecology for ultimate management of her recurrent dysmenorrhea. Pregnancy negative. Stable for discharge. Advised NSAIDs. Return precautions given. Patient states understanding of treatment care plan and is agreeable.  Carman Ching, PA-C 01/03/15 1825  Quintella Reichert, MD 01/03/15 [phone]

## 2015-01-03 NOTE — ED Notes (Addendum)
Pt sts lower abd pain that cramping in nature; same monthly with period cycles per pt; pt sts pain into back; pt sts nausea and vomiting x 1 today

## 2015-01-03 NOTE — Discharge Instructions (Signed)
Take ibuprofen as prescribed for pain.  Dysmenorrhea Menstrual cramps (dysmenorrhea) are caused by the muscles of the uterus tightening (contracting) during a menstrual period. For some women, this discomfort is merely bothersome. For others, dysmenorrhea can be severe enough to interfere with everyday activities for a few days each month. Primary dysmenorrhea is menstrual cramps that last a couple of days when you start having menstrual periods or soon after. This often begins after a teenager starts having her period. As a woman gets older or has a baby, the cramps will usually lessen or disappear. Secondary dysmenorrhea begins later in life, lasts longer, and the pain may be stronger than primary dysmenorrhea. The pain may start before the period and last a few days after the period.  CAUSES  Dysmenorrhea is usually caused by an underlying problem, such as:  The tissue lining the uterus grows outside of the uterus in other areas of the body (endometriosis).  The endometrial tissue, which normally lines the uterus, is found in or grows into the muscular walls of the uterus (adenomyosis).  The pelvic blood vessels are engorged with blood just before the menstrual period (pelvic congestive syndrome).  Overgrowth of cells (polyps) in the lining of the uterus or cervix.  Falling down of the uterus (prolapse) because of loose or stretched ligaments.  Depression.  Bladder problems, infection, or inflammation.  Problems with the intestine, a tumor, or irritable bowel syndrome.  Cancer of the female organs or bladder.  A severely tipped uterus.  A very tight opening or closed cervix.  Noncancerous tumors of the uterus (fibroids).  Pelvic inflammatory disease (PID).  Pelvic scarring (adhesions) from a previous surgery.  Ovarian cyst.  An intrauterine device (IUD) used for birth control. RISK FACTORS You may be at greater risk of dysmenorrhea if:  You are younger than age 54.  You  started puberty early.  You have irregular or heavy bleeding.  You have never given birth.  You have a family history of this problem.  You are a smoker. SIGNS AND SYMPTOMS   Cramping or throbbing pain in your lower abdomen.  Headaches.  Lower back pain.  Nausea or vomiting.  Diarrhea.  Sweating or dizziness.  Loose stools. DIAGNOSIS  A diagnosis is based on your history, symptoms, physical exam, diagnostic tests, or procedures. Diagnostic tests or procedures may include:  Blood tests.  Ultrasonography.  An examination of the lining of the uterus (dilation and curettage, D&C).  An examination inside your abdomen or pelvis with a scope (laparoscopy).  X-rays.  CT scan.  MRI.  An examination inside the bladder with a scope (cystoscopy).  An examination inside the intestine or stomach with a scope (colonoscopy, gastroscopy). TREATMENT  Treatment depends on the cause of the dysmenorrhea. Treatment may include:  Pain medicine prescribed by your health care provider.  Birth control pills or an IUD with progesterone hormone in it.  Hormone replacement therapy.  Nonsteroidal anti-inflammatory drugs (NSAIDs). These may help stop the production of prostaglandins.  Surgery to remove adhesions, endometriosis, ovarian cyst, or fibroids.  Removal of the uterus (hysterectomy).  Progesterone shots to stop the menstrual period.  Cutting the nerves on the sacrum that go to the female organs (presacral neurectomy).  Electric current to the sacral nerves (sacral nerve stimulation).  Antidepressant medicine.  Psychiatric therapy, counseling, or group therapy.  Exercise and physical therapy.  Meditation and yoga therapy.  Acupuncture. HOME CARE INSTRUCTIONS   Only take over-the-counter or prescription medicines as directed by your health  care provider.  Place a heating pad or hot water bottle on your lower back or abdomen. Do not sleep with the heating  pad.  Use aerobic exercises, walking, swimming, biking, and other exercises to help lessen the cramping.  Massage to the lower back or abdomen may help.  Stop smoking.  Avoid alcohol and caffeine. SEEK MEDICAL CARE IF:   Your pain does not get better with medicine.  You have pain with sexual intercourse.  Your pain increases and is not controlled with medicines.  You have abnormal vaginal bleeding with your period.  You develop nausea or vomiting with your period that is not controlled with medicine. SEEK IMMEDIATE MEDICAL CARE IF:  You pass out.  Document Released: 05/29/2005 Document Revised: 01/29/2013 Document Reviewed: 11/14/2012 [facility] Patient Information 2015 [place], [place]. This information is not intended to replace advice given to you by your health care provider. Make sure you discuss any questions you have with your health care provider.

## 2015-01-31 ENCOUNTER — Emergency Department (HOSPITAL_COMMUNITY)
Admission: EM | Admit: 2015-01-31 | Discharge: 2015-01-31 | Disposition: A | Discharge status: Home / Self Care | Payer: Self-pay | Attending: Emergency Medicine | Admitting: Emergency Medicine

## 2015-01-31 ENCOUNTER — Encounter (HOSPITAL_COMMUNITY): Payer: Self-pay

## 2015-01-31 DIAGNOSIS — Z87891 Personal history of nicotine dependence: Secondary | ICD-10-CM | POA: Insufficient documentation

## 2015-01-31 DIAGNOSIS — Z3202 Encounter for pregnancy test, result negative: Secondary | ICD-10-CM | POA: Insufficient documentation

## 2015-01-31 DIAGNOSIS — N946 Dysmenorrhea, unspecified: Secondary | ICD-10-CM | POA: Insufficient documentation

## 2015-01-31 LAB — COMPREHENSIVE METABOLIC PANEL
ALT: 18 U/L (ref 14–54)
AST: 26 U/L (ref 15–41)
Albumin: 4.3 g/dL (ref 3.5–5.0)
Alkaline Phosphatase: 43 U/L (ref 38–126)
Anion gap: 12 (ref 5–15)
BUN: 13 mg/dL (ref 6–20)
CHLORIDE: 105 mmol/L (ref 101–111)
CO2: 22 mmol/L (ref 22–32)
CREATININE: 0.89 mg/dL (ref 0.44–1.00)
Calcium: 9.5 mg/dL (ref 8.9–10.3)
GFR calc Af Amer: >60 mL/min (ref >60)
Glucose, Bld: 147 mg/dL — ABNORMAL HIGH (ref 65–99)
Potassium: 3.5 mmol/L (ref 3.5–5.1)
SODIUM: 139 mmol/L (ref 135–145)
Total Bilirubin: 0.7 mg/dL (ref 0.3–1.2)
Total Protein: 7.6 g/dL (ref 6.5–8.1)

## 2015-01-31 LAB — CBC
HCT: 38.5 % (ref 36.0–46.0)
Hemoglobin: 13.3 g/dL (ref 12.0–15.0)
MCH: 32.6 pg (ref 26.0–34.0)
MCHC: 34.5 g/dL (ref 30.0–36.0)
MCV: 94.4 fL (ref 78.0–100.0)
PLATELETS: 256 10*3/uL (ref 150–400)
RBC: 4.08 MIL/uL (ref 3.87–5.11)
RDW: 13.2 % (ref 11.5–15.5)
WBC: 15.8 10*3/uL — ABNORMAL HIGH (ref 4.0–10.5)

## 2015-01-31 LAB — URINE MICROSCOPIC-ADD ON

## 2015-01-31 LAB — URINALYSIS, ROUTINE W REFLEX MICROSCOPIC
BILIRUBIN URINE: NEGATIVE
GLUCOSE, UA: NEGATIVE mg/dL
KETONES UR: 15 mg/dL — AB
Leukocytes, UA: NEGATIVE
Nitrite: NEGATIVE
PH: 5.5 (ref 5.0–8.0)
Protein, ur: NEGATIVE mg/dL
Specific Gravity, Urine: 1.031 — ABNORMAL HIGH (ref 1.005–1.030)
Urobilinogen, UA: 1 mg/dL (ref 0.0–1.0)

## 2015-01-31 LAB — I-STAT BETA HCG BLOOD, ED (MC, WL, AP ONLY): I-stat hCG, quantitative: <5 m[IU]/mL (ref <5)

## 2015-01-31 LAB — LIPASE, BLOOD: LIPASE: 20 U/L — AB (ref 22–51)

## 2015-01-31 MED ORDER — IBUPROFEN 400 MG PO TABS
400.0000 mg | ORAL_TABLET | Freq: Once | ORAL | Status: AC
  Administered 2015-01-31: 400 mg via ORAL

## 2015-01-31 MED ORDER — KETOROLAC TROMETHAMINE 30 MG/ML IJ SOLN
30.0000 mg | Freq: Once | INTRAMUSCULAR | Status: AC
  Administered 2015-01-31: 30 mg via INTRAVENOUS
  Filled 2015-01-31: qty 1

## 2015-01-31 MED ORDER — ONDANSETRON 4 MG PO TBDP
4.0000 mg | ORAL_TABLET | Freq: Once | ORAL | Status: AC | PRN
  Administered 2015-01-31: 4 mg via ORAL

## 2015-01-31 MED ORDER — IBUPROFEN 400 MG PO TABS
ORAL_TABLET | ORAL | Status: AC
  Filled 2015-01-31: qty 1

## 2015-01-31 MED ORDER — ONDANSETRON 4 MG PO TBDP
ORAL_TABLET | ORAL | Status: AC
  Filled 2015-01-31: qty 1

## 2015-01-31 MED ORDER — HYDROCODONE-ACETAMINOPHEN 5-325 MG PO TABS: 1.0000 | ORAL_TABLET | Freq: Four times a day (QID) | ORAL | Status: DC | PRN

## 2015-01-31 MED ORDER — IBUPROFEN 800 MG PO TABS: 800.0000 mg | ORAL_TABLET | Freq: Three times a day (TID) | ORAL | Status: DC | PRN

## 2015-01-31 MED ORDER — MORPHINE SULFATE (PF) 4 MG/ML IV SOLN
4.0000 mg | Freq: Once | INTRAVENOUS | Status: AC
  Administered 2015-01-31: 4 mg via INTRAVENOUS
  Filled 2015-01-31: qty 1

## 2015-01-31 MED ORDER — SODIUM CHLORIDE 0.9 % IV BOLUS (SEPSIS)
1000.0000 mL | Freq: Once | INTRAVENOUS | Status: AC
  Administered 2015-01-31: 1000 mL via INTRAVENOUS

## 2015-01-31 MED ORDER — ONDANSETRON HCL 4 MG/2ML IJ SOLN
4.0000 mg | Freq: Once | INTRAMUSCULAR | Status: AC
  Administered 2015-01-31: 4 mg via INTRAVENOUS
  Filled 2015-01-31: qty 2

## 2015-01-31 NOTE — Discharge Instructions (Signed)
Return here as needed.  [facility].  They see patients without insurance

## 2015-01-31 NOTE — ED Provider Notes (Signed)
CSN: [id number]     Arrival date & time 01/31/15  1357 History   First MD Initiated Contact with Patient 01/31/15 1639     Chief Complaint  Patient presents with  . Dysmenorrhea     (Consider location/radiation/quality/duration/timing/severity/associated sxs/prior Treatment) HPI Patient presents to the emergency department with menstrual bleeding and cramping.  The patient states she has severe cramps during her menstrual cycles.  She states that her menstrual cycle started this morning and she noticed she was having severe cramping.  She states that she needs to get a doctor to help follow along with these issues.  The patient denies chest pain, shortness of breath, nausea, vomiting, weakness, dizziness, headache, blurred vision, back pain, neck pain, fever, lightheadedness, near syncope or syncope.  The patient states she did not take any medications prior to arrival.  She states sometimes Tylenol helps with the discomfort but not on a consistent basis History reviewed. No pertinent past medical history. Past Surgical History  Procedure Laterality Date  . Hernia repair     History reviewed. No pertinent family history. Social History  Substance Use Topics  . Smoking status: Former Smoker -- 0.30 packs/day    Types: Cigarettes    Quit date: 11/10/2012  . Smokeless tobacco: None  . Alcohol Use: Yes     Comment: occasionally   OB History    No data available     Review of Systems  All other systems negative except as documented in the HPI. All pertinent positives and negatives as reviewed in the HPI.  Allergies  Antihistamines, diphenhydramine-type and Benadryl  Home Medications   Prior to Admission medications   Medication Sig Start Date End Date Taking? Authorizing Provider  ibuprofen (ADVIL,MOTRIN) 600 MG tablet Take 1 tablet (600 mg total) by mouth every 8 (eight) hours as needed. 01/03/15   Robyn M Hess, PA-C   BP 137/77 mmHg  Pulse 55  Temp(Src) 98.3 F (36.8 C)  (Oral)  Resp 16  Ht 4\' 9"  (1.448 m)  Wt 113 lb 12.8 oz (51.619 kg)  BMI 24.62 kg/m2  SpO2 97%  LMP 01/31/2015 Physical Exam  Constitutional: She is oriented to person, place, and time. She appears well-developed and well-nourished. No distress.  HENT:  Head: Normocephalic and atraumatic.  Mouth/Throat: Oropharynx is clear and moist.  Eyes: Pupils are equal, round, and reactive to light.  Neck: Normal range of motion. Neck supple.  Cardiovascular: Normal rate, regular rhythm and normal heart sounds.  Exam reveals no gallop and no friction rub.   No murmur heard. Pulmonary/Chest: Effort normal and breath sounds normal. No respiratory distress.  Abdominal: Soft. Bowel sounds are normal. She exhibits no distension. There is tenderness. There is no rebound and no guarding.  Neurological: She is alert and oriented to person, place, and time. She exhibits normal muscle tone. Coordination normal.  Skin: Skin is warm and dry. No rash noted. No erythema.  Psychiatric: She has a normal mood and affect. Her behavior is normal.  Nursing note and vitals reviewed.   ED Course  Procedures (including critical care time) Labs Review Labs Reviewed  LIPASE, BLOOD - Abnormal; Notable for the following:    Lipase 20 (*)    All other components within normal limits  COMPREHENSIVE METABOLIC PANEL - Abnormal; Notable for the following:    Glucose, Bld 147 (*)    All other components within normal limits  CBC - Abnormal; Notable for the following:    WBC 15.8 (*)  All other components within normal limits  URINALYSIS, ROUTINE W REFLEX MICROSCOPIC (NOT AT [facility]) - Abnormal; Notable for the following:    APPearance CLOUDY (*)    Specific Gravity, Urine 1.031 (*)    Hgb urine dipstick LARGE (*)    Ketones, ur 15 (*)    All other components within normal limits  URINE MICROSCOPIC-ADD ON  I-STAT BETA HCG BLOOD, ED ([facility], [facility], [facility] ONLY)    Imaging Review No results found. I have personally reviewed and  evaluated these images and lab results as part of my medical decision-making.  She will be treated for her menstrual cramping.  She will be advised follow-up a GYN at [facility] hospital clinic.  The patient is told to return here as needed.  She is being treated for these menstrual cramps and their severity.  There is no other comp getting factors to her treatment course    Dalia Heading, PA-C 01/31/15 [place], MD 01/31/15 2312

## 2015-01-31 NOTE — ED Notes (Signed)
Pt reports onset this morning period.  Reports she gets cramps every month with period but never this severe.

## 2015-02-25 ENCOUNTER — Encounter: Payer: Self-pay | Admitting: Medical

## 2015-04-08 ENCOUNTER — Emergency Department (HOSPITAL_COMMUNITY)
Admission: EM | Admit: 2015-04-08 | Discharge: 2015-04-08 | Disposition: A | Discharge status: Home / Self Care | Payer: Self-pay | Attending: Emergency Medicine | Admitting: Emergency Medicine

## 2015-04-08 ENCOUNTER — Encounter (HOSPITAL_COMMUNITY): Payer: Self-pay | Admitting: General Practice

## 2015-04-08 DIAGNOSIS — Z3202 Encounter for pregnancy test, result negative: Secondary | ICD-10-CM | POA: Insufficient documentation

## 2015-04-08 DIAGNOSIS — N946 Dysmenorrhea, unspecified: Secondary | ICD-10-CM | POA: Insufficient documentation

## 2015-04-08 DIAGNOSIS — Z87891 Personal history of nicotine dependence: Secondary | ICD-10-CM | POA: Insufficient documentation

## 2015-04-08 LAB — POC URINE PREG, ED: PREG TEST UR: NEGATIVE

## 2015-04-08 MED ORDER — ONDANSETRON 4 MG PO TBDP
4.0000 mg | ORAL_TABLET | Freq: Once | ORAL | Status: AC
  Administered 2015-04-08: 4 mg via ORAL
  Filled 2015-04-08: qty 1

## 2015-04-08 MED ORDER — KETOROLAC TROMETHAMINE 30 MG/ML IJ SOLN
30.0000 mg | Freq: Once | INTRAMUSCULAR | Status: AC
  Administered 2015-04-08: 30 mg via INTRAMUSCULAR
  Filled 2015-04-08: qty 1

## 2015-04-08 NOTE — ED Notes (Signed)
Pt presents to the ED with complaints of abdominal cramping, associated with her menstrual cycle. Pts menstrual cycle started this morning at 0530. Pt took 400 mg of Ibuprofen. Pt rating pain a 7/10. Pt also reporting N/V.

## 2015-04-08 NOTE — Discharge Instructions (Signed)

## 2015-04-08 NOTE — ED Provider Notes (Signed)
CSN: [id number]     Arrival date & time 04/08/15  2297 History   First MD Initiated Contact with Patient 04/08/15 [phone]     Chief Complaint  Patient presents with  . Abdominal Cramping     Patient is a 29 y.o. female presenting with cramps. The history is provided by the patient. No language interpreter was used.  Abdominal Cramping   Ms. Guereca presents for evaluation of menstrual cramps. She has a history of severe menstrual cramps since she was in the eighth grade. Her menstrual cycle started this morning and she develops serious cramps in her lower abdomen at 5:30. She has associated nausea and one episode of emesis. She denies any fevers, diarrhea, vaginal discharge. She had a [facility] Pap [facility] without any abnormalities. She states that Toradol often works for her pain. She is scheduled to be seen by OB/GYN on November 12.  History reviewed. No pertinent past medical history. Past Surgical History  Procedure Laterality Date  . Hernia repair     No family history on file. Social History  Substance Use Topics  . Smoking status: Former Smoker -- 0.30 packs/day    Types: Cigarettes    Quit date: 11/10/2012  . Smokeless tobacco: None  . Alcohol Use: Yes     Comment: occasionally   OB History    No data available     Review of Systems  All other systems reviewed and are negative.     Allergies  Antihistamines, diphenhydramine-type and Benadryl  Home Medications   Prior to Admission medications   Medication Sig Start Date End Date Taking? Authorizing Provider  acetaminophen (TYLENOL) 500 MG tablet Take 1,000 mg by mouth every 6 (six) hours as needed for mild pain.   Yes Historical Provider, MD  ibuprofen (ADVIL,MOTRIN) 200 MG tablet Take 400 mg by mouth every 6 (six) hours as needed for mild pain.   Yes Historical Provider, MD  ibuprofen (ADVIL,MOTRIN) 800 MG tablet Take 1 tablet (800 mg total) by mouth every 8 (eight) hours as  needed. Patient not taking: Reported on 04/08/2015 01/31/15   Dalia Heading, PA-C   BP 153/95 mmHg  Pulse 70  Temp(Src) 98 F (36.7 C) (Oral)  Resp 20  SpO2 98%  LMP 04/08/2015 (Exact Date) Physical Exam  Constitutional: She is oriented to person, place, and time. She appears well-developed and well-nourished.  HENT:  Head: Normocephalic and atraumatic.  Cardiovascular: Normal rate and regular rhythm.   No murmur heard. Pulmonary/Chest: Effort normal and breath sounds normal. No respiratory distress.  Abdominal: Soft. There is no rebound and no guarding.  Mild suprapubic tenderness  Musculoskeletal: She exhibits no edema or tenderness.  Neurological: She is alert and oriented to person, place, and time.  Skin: Skin is warm and dry.  Psychiatric: She has a normal mood and affect. Her behavior is normal.  Nursing note and vitals reviewed.   ED Course  Procedures (including critical care time) Labs Review Labs Reviewed  POC URINE PREG, ED    Imaging Review No results found. I have personally reviewed and evaluated these images and lab results as part of my medical decision-making.   EKG Interpretation None      MDM   Final diagnoses:  Dysmenorrhea    Pt here for menstrual cramp pain, improved in recheck after toradol in the ED.  Discussed OBGYN follow up, return precautions.      Quintella Reichert, MD 04/09/15 1159

## 2015-05-04 ENCOUNTER — Encounter (HOSPITAL_COMMUNITY): Payer: Self-pay | Admitting: Advanced Practice Midwife

## 2015-05-04 ENCOUNTER — Inpatient Hospital Stay (HOSPITAL_COMMUNITY): Payer: Self-pay

## 2015-05-04 ENCOUNTER — Inpatient Hospital Stay (HOSPITAL_COMMUNITY)
Admission: AD | Admit: 2015-05-04 | Discharge: 2015-05-04 | Disposition: A | Discharge status: Home / Self Care | Payer: Self-pay | Source: Ambulatory Visit | Attending: Obstetrics & Gynecology | Admitting: Obstetrics & Gynecology

## 2015-05-04 DIAGNOSIS — R109 Unspecified abdominal pain: Secondary | ICD-10-CM | POA: Insufficient documentation

## 2015-05-04 DIAGNOSIS — N946 Dysmenorrhea, unspecified: Secondary | ICD-10-CM | POA: Insufficient documentation

## 2015-05-04 DIAGNOSIS — N83202 Unspecified ovarian cyst, left side: Secondary | ICD-10-CM | POA: Insufficient documentation

## 2015-05-04 DIAGNOSIS — Z87891 Personal history of nicotine dependence: Secondary | ICD-10-CM | POA: Insufficient documentation

## 2015-05-04 DIAGNOSIS — R1031 Right lower quadrant pain: Secondary | ICD-10-CM

## 2015-05-04 DIAGNOSIS — N760 Acute vaginitis: Secondary | ICD-10-CM | POA: Insufficient documentation

## 2015-05-04 DIAGNOSIS — R102 Pelvic and perineal pain: Secondary | ICD-10-CM | POA: Insufficient documentation

## 2015-05-04 DIAGNOSIS — D259 Leiomyoma of uterus, unspecified: Secondary | ICD-10-CM | POA: Insufficient documentation

## 2015-05-04 LAB — URINALYSIS, ROUTINE W REFLEX MICROSCOPIC
BILIRUBIN URINE: NEGATIVE
Glucose, UA: NEGATIVE mg/dL
Ketones, ur: 15 mg/dL — AB
LEUKOCYTES UA: NEGATIVE
NITRITE: NEGATIVE
PH: 7.5 (ref 5.0–8.0)
Protein, ur: NEGATIVE mg/dL
SPECIFIC GRAVITY, URINE: 1.015 (ref 1.005–1.030)

## 2015-05-04 LAB — CBC
HEMATOCRIT: 37.2 % (ref 36.0–46.0)
Hemoglobin: 12.4 g/dL (ref 12.0–15.0)
MCH: 32 pg (ref 26.0–34.0)
MCHC: 33.3 g/dL (ref 30.0–36.0)
MCV: 96.1 fL (ref 78.0–100.0)
PLATELETS: 226 10*3/uL (ref 150–400)
RBC: 3.87 MIL/uL (ref 3.87–5.11)
RDW: 13.9 % (ref 11.5–15.5)
WBC: 16.9 10*3/uL — ABNORMAL HIGH (ref 4.0–10.5)

## 2015-05-04 LAB — WET PREP, GENITAL
Sperm: NONE SEEN
Trich, Wet Prep: NONE SEEN
Yeast Wet Prep HPF POC: NONE SEEN

## 2015-05-04 LAB — URINE MICROSCOPIC-ADD ON

## 2015-05-04 LAB — POCT PREGNANCY, URINE: Preg Test, Ur: NEGATIVE

## 2015-05-04 MED ORDER — OXYCODONE-ACETAMINOPHEN 5-325 MG PO TABS: 1.0000 | ORAL_TABLET | Freq: Four times a day (QID) | ORAL | Status: DC | PRN

## 2015-05-04 MED ORDER — METRONIDAZOLE 500 MG PO TABS: 500.0000 mg | ORAL_TABLET | Freq: Two times a day (BID) | ORAL | Status: DC

## 2015-05-04 MED ORDER — MEDROXYPROGESTERONE ACETATE 150 MG/ML IM SUSP
150.0000 mg | Freq: Once | INTRAMUSCULAR | Status: AC
  Administered 2015-05-04: 150 mg via INTRAMUSCULAR
  Filled 2015-05-04: qty 1

## 2015-05-04 NOTE — MAU Provider Note (Signed)
Chief Complaint: Abdominal Pain  First Provider Initiated Contact with Patient 05/04/15 1840      SUBJECTIVE HPI: Sonya Nguyen is a 29 y.o. G3P0030 who presents to [facility] reporting severe menstrual cramps, N/V, bloody emesis. Menstrual period started today. Has been seen in ED 8 times for same. [facility] for appt in [facility] in September 2016 and has not rescheduled. Used to go to Dr. Simona Huh, but lost Medicaid. Pain, N/V is same as w/ every menstrual cycle although pain sometimes is suprapubic and sometimes more on one side or the other of the abd or in right hip.  Was given fentanyl 250 g in the ambulance.  Has tried NSAIDS, various OCPs, Depo in the post. Depo is the only thing that helped, but it caused significant weight gain and she is not willing to use it again. States Dr. Margie Billet discussed laparoscopy because she thought the pain might be endometriosis, but the patient was not able to go back.   Reports vomiting 16 times today because of the pain. States there was frank bright red blood last time she vomited in the ambulance. Was given antiemetic in ambulance and is no longer having any nausea. EMS did not report bloody emesis.  Patient states she has been in a mutually monogamous relationship 2 years. States she had STD testing at health department in October, but would like it repeated because she continues to have malodorous discharge.   Location: Right lower quadrant, right hip, rectum Quality: Throbbing, pressure Severity: 10/10 on pain scale Duration: 4 hours Context: With menses Timing: Constant Modifying factors: Pain improved significantly with fentanyl. No improvement with ibuprofen, Tylenol, warm compresses. Associated signs and symptoms: Positive for nausea, vomiting, hematemesis, malodorous vaginal discharge, menstrual bleeding. Negative for fever, chills, diarrhea, constipation, urinary complaints, dyspareunia or intermenstrual  bleeding.  Per nurse's note patient had surgery for endometriosis, but patient clarified that she has not. The only surgery she has had his hernia surgery.  History reviewed. No pertinent past medical history. OB History  Gravida Para Term Preterm AB SAB TAB Ectopic Multiple Living  3    3 3         # Outcome Date GA Lbr Len/2nd Weight Sex Delivery Anes PTL Lv  3 SAB           2 SAB           1 SAB              Past Surgical History  Procedure Laterality Date  . Hernia repair     Social History   Social History  . Marital Status: Single    Spouse Name: N/A  . Number of Children: N/A  . Years of Education: N/A   Occupational History  . Not on file.   Social History Main Topics  . Smoking status: Former Smoker -- 0.30 packs/day    Types: Cigarettes    Quit date: 11/10/2012  . Smokeless tobacco: Not on file  . Alcohol Use: Yes     Comment: occasionally  . Drug Use: No  . Sexual Activity: Yes    Birth Control/ Protection: ondom   Other Topics Concern  . Not on file   Social History Narrative   No current facility-administered medications on file prior to encounter.   Current Outpatient Prescriptions on File Prior to Encounter  Medication Sig Dispense Refill  . acetaminophen (TYLENOL) 500 MG tablet Take 1,000 mg by mouth every 6 (six) hours as needed for mild pain.    Marland Kitchen  ibuprofen (ADVIL,MOTRIN) 200 MG tablet Take 400 mg by mouth every 6 (six) hours as needed for mild pain.    Marland Kitchen ibuprofen (ADVIL,MOTRIN) 800 MG tablet Take 1 tablet (800 mg total) by mouth every 8 (eight) hours as needed. (Patient not taking: Reported on 04/08/2015) 21 tablet 0   Allergies  Allergen Reactions  . Antihistamines, Diphenhydramine-Type Other (See Comments)    Causes skin to burn  . Benadryl [Diphenhydramine Hcl] Other (See Comments)    Causes skin to burn    I have reviewed the past Medical Hx, Surgical Hx, Social Hx, Allergies and Medications.   Review of Systems  Constitutional:  Negative for fever, chills and appetite change.  HENT: Negative for trouble swallowing.   Gastrointestinal: Positive for nausea, vomiting, abdominal pain and rectal pain. Negative for diarrhea, constipation, blood in stool and abdominal distention.       Positive for hematemesis  Genitourinary: Positive for vaginal bleeding and vaginal discharge. Negative for dysuria, urgency, frequency, hematuria, flank pain, genital sores and vaginal pain.  Musculoskeletal: Negative for myalgias and back pain.  Neurological: Syncope: right inguinal.  Hematological: Positive for adenopathy.    OBJECTIVE Patient Vitals for the past 24 hrs:  BP Temp Temp src Pulse Resp  05/04/15 1827 113/86 mmHg 98.3 F (36.8 C) Oral 82 18   Constitutional: Well-developed, well-nourished female in mild distress.  Cardiovascular: normal rate Respiratory: normal rate and effort.  GI: Abd soft, moderate right groin tenderness. Pos BS x 4. MS: Extremities nontender, no edema, normal ROM Neurologic: Alert and oriented x 4.  GU: Neg CVAT.  SPECULUM EXAM: NEFG, small-moderate amount of bright red blood noted, cervix clean, deviated to left.  BIMANUAL: cervix closed; uterus normal size, moderate right adnexal tenderness. Superficial 3 cm right adnexal mass in area of hernia repair is only palpable on bimanual exam. Not palpable on abdominal exam. No bulging with Valsalva maneuver. Positive CMT. Positive tenderness in the posterior fornix.  LAB RESULTS Results for orders placed or performed during the hospital encounter of 05/04/15 (from the past 24 hour(s))  CBC     Status: Abnormal   Collection Time: 05/04/15  6:42 PM  Result Value Ref Range   WBC 16.9 (H) 4.0 - 10.5 K/uL   RBC 3.87 3.87 - 5.11 MIL/uL   Hemoglobin 12.4 12.0 - 15.0 g/dL   HCT 37.2 36.0 - 46.0 %   MCV 96.1 78.0 - 100.0 fL   MCH 32.0 26.0 - 34.0 pg   MCHC 33.3 30.0 - 36.0 g/dL   RDW 13.9 11.5 - 15.5 %   Platelets 226 150 - 400 K/uL  Wet prep, genital      Status: Abnormal   Collection Time: 05/04/15  7:00 PM  Result Value Ref Range   Yeast Wet Prep HPF POC NONE SEEN NONE SEEN   Trich, Wet Prep NONE SEEN NONE SEEN   Clue Cells Wet Prep HPF POC PRESENT (A) NONE SEEN   WBC, Wet Prep HPF POC FEW (A) NONE SEEN   Sperm NONE SEEN     IMAGING No results found.  MAU COURSE CBC, UA, UPT, GC/chlamydia cultures, wet prep, pelvic ultrasound.  Discussed with Dr. Gala Romney who examined patient and does not think she needs a CT at this time. Recommends that patient get Depo-Provera shot here in MAU to bridge her to [place] in clinic. Patient agrees. Depo-Provera ordered.  Care of patient turned over to Kathrine Haddock, CNM at 8:05 PM. Patient in ultrasound.  [place], [place] 05/04/2015  8:08 PM  2010 Pt reassessed; reports nausea has continued to not be present and is requesting ice or water.  Pain decreased to a 5/10.    Ultrasound: FINDINGS: Uterus  Measurements: 8.4 x 3.7 x 4.6 cm. Two small uterine fibroids noted 1 in the right body measuring 10 mm and a second in the fundus measuring 15 mm. Otherwise normal uterine size.  Endometrium  Thickness: 3.6 mm. No focal abnormality visualized.  Right ovary  Measurements: 3.0 x 1.8 x 1.7 cm. Normal appearance/no adnexal mass.  Left ovary  Measurements: 3.9 x 2.4 x 3.4 cm. Left ovary contains a complex septated heterogeneous cyst measuring 2.9 x 1.6 x 2.6 cm, possibly hemorrhagic.  Pulsed Doppler evaluation of both ovaries demonstrates normal low-resistance arterial and venous waveforms.  Other findings  Trace pelvic free fluid likely physiologic  IMPRESSION: Small uterine fibroids  2.9 cm complex septated left ovarian cyst, possibly hemorrhagic  No evidence of torsion  physiologic free fluid  Assessment: Left ovarian cyst Small uterine fibroids Bacterial Vaginosis  Plan: DMPA received in MAU after patient discussed options with Dr. Gala Romney [facility] 4-6 wks RX Percocet 5/325 (#15) Flagyl 500 mg BID x 7 days  Gwen Pounds, CNM

## 2015-05-04 NOTE — MAU Note (Signed)
Hx of endometriosis.  Has had surgery for same.  First day of cycle is always the worst.  Taken OTC meds, with no relief.  Pt was screaming per EMT's BP was elevated.  IV started and 263mcg of Fentanyl was given.  BP came down; pt calm on arrival

## 2015-05-05 ENCOUNTER — Telehealth: Payer: Self-pay

## 2015-05-05 LAB — HIV ANTIBODY (ROUTINE TESTING W REFLEX): HIV Screen 4th Generation wRfx: NONREACTIVE

## 2015-05-05 NOTE — Telephone Encounter (Signed)
Attempted to contact pt in regards to calling and setting up appt to [facility] for Nexplanon per Dr.Leggett.  Unable to contact pt due to telephone # is not in service.  No other contact # to reach patient.  Letter sent to pt with contact # to [facility] to schedule appt.  Informed by a nurse from [facility] that patient will need to scheduled appt herself.

## 2015-05-06 LAB — GC/CHLAMYDIA PROBE AMP (~~LOC~~) NOT AT ARMC
Chlamydia: NEGATIVE
Neisseria Gonorrhea: NEGATIVE

## 2015-05-11 ENCOUNTER — Encounter: Payer: Self-pay | Admitting: Family

## 2015-08-06 ENCOUNTER — Telehealth (HOSPITAL_COMMUNITY): Payer: Self-pay

## 2015-08-06 NOTE — Telephone Encounter (Signed)
Pt calling for work note for 08/04/2015.  Informed pt not showing encounter for 08/04/2015.

## 2015-08-17 ENCOUNTER — Inpatient Hospital Stay (HOSPITAL_COMMUNITY): Payer: Self-pay

## 2015-08-17 ENCOUNTER — Inpatient Hospital Stay (HOSPITAL_COMMUNITY)
Admission: AD | Admit: 2015-08-17 | Discharge: 2015-08-17 | Disposition: A | Discharge status: Home / Self Care | Payer: Self-pay | Source: Ambulatory Visit | Attending: Obstetrics & Gynecology | Admitting: Obstetrics & Gynecology

## 2015-08-17 DIAGNOSIS — Z87891 Personal history of nicotine dependence: Secondary | ICD-10-CM | POA: Insufficient documentation

## 2015-08-17 DIAGNOSIS — D219 Benign neoplasm of connective and other soft tissue, unspecified: Secondary | ICD-10-CM

## 2015-08-17 DIAGNOSIS — N946 Dysmenorrhea, unspecified: Secondary | ICD-10-CM | POA: Insufficient documentation

## 2015-08-17 DIAGNOSIS — N83209 Unspecified ovarian cyst, unspecified side: Secondary | ICD-10-CM

## 2015-08-17 DIAGNOSIS — R102 Pelvic and perineal pain: Secondary | ICD-10-CM | POA: Insufficient documentation

## 2015-08-17 DIAGNOSIS — R109 Unspecified abdominal pain: Secondary | ICD-10-CM | POA: Insufficient documentation

## 2015-08-17 DIAGNOSIS — N83201 Unspecified ovarian cyst, right side: Secondary | ICD-10-CM | POA: Insufficient documentation

## 2015-08-17 DIAGNOSIS — N83202 Unspecified ovarian cyst, left side: Secondary | ICD-10-CM

## 2015-08-17 DIAGNOSIS — N939 Abnormal uterine and vaginal bleeding, unspecified: Secondary | ICD-10-CM

## 2015-08-17 DIAGNOSIS — D259 Leiomyoma of uterus, unspecified: Secondary | ICD-10-CM

## 2015-08-17 LAB — URINE MICROSCOPIC-ADD ON

## 2015-08-17 LAB — URINALYSIS, ROUTINE W REFLEX MICROSCOPIC
BILIRUBIN URINE: NEGATIVE
Glucose, UA: NEGATIVE mg/dL
KETONES UR: NEGATIVE mg/dL
LEUKOCYTES UA: NEGATIVE
NITRITE: NEGATIVE
PROTEIN: 30 mg/dL — AB
Specific Gravity, Urine: >1.03 — ABNORMAL HIGH (ref 1.005–1.030)
pH: 5.5 (ref 5.0–8.0)

## 2015-08-17 LAB — POCT PREGNANCY, URINE: PREG TEST UR: NEGATIVE

## 2015-08-17 MED ORDER — OXYCODONE-ACETAMINOPHEN 5-325 MG PO TABS: 1.0000 | ORAL_TABLET | Freq: Four times a day (QID) | ORAL | Status: DC | PRN

## 2015-08-17 MED ORDER — HYDROMORPHONE HCL 1 MG/ML IJ SOLN
1.0000 mg | Freq: Once | INTRAMUSCULAR | Status: AC
  Administered 2015-08-17: 1 mg via INTRAMUSCULAR
  Filled 2015-08-17: qty 1

## 2015-08-17 NOTE — MAU Note (Signed)
Patient with history or painful menses over the years. Usually able to control with tylenol and motrin most months. However these meds are not working, she is also having vomiting x 3 today.

## 2015-08-17 NOTE — MAU Note (Signed)
Pt reports she has had "issues" with cramping with her periods for the last 3 years, was given Depo in November and did not go for the next dose. States the cramping is really bad and ibuprofen makes her throw up.

## 2015-08-17 NOTE — Discharge Instructions (Signed)
Uterine Fibroids  Uterine fibroids are tissue masses (tumors). They are also called leiomyomas. They can develop inside of a woman's womb (uterus). They can grow very large. Fibroids are not cancerous (benign). Most fibroids do not require medical treatment.  HOME CARE  · Keep all follow-up visits as told by your doctor. This is important.  · Take medicines only as told by your doctor.    If you were prescribed a hormone treatment, take the hormone medicines exactly as told.    Do not take aspirin. It can cause bleeding.  · Ask your doctor about taking iron pills and increasing the amount of dark green, leafy vegetables in your diet. These actions can help to boost your blood iron levels.  · Pay close attention to your period. Tell your doctor about any changes, such as:    Increased blood flow. This may require you to use more pads or tampons than usual per month.    A change in the number of days that your period lasts per month.    A change in symptoms that come with your period, such as back pain or cramping in your belly area (abdomen).  GET HELP IF:  · You have pain in your back or the area between your hip bones (pelvic area) that is not controlled by medicines.  · You have pain in your abdomen that is not controlled with medicines.  · You have an increase in bleeding between and during periods.  · You soak tampons or pads in a half hour or less.  · You feel lightheaded.  · You feel extra tired.  · You feel weak.  GET HELP RIGHT AWAY IF:   · You pass out (faint).  · You have a sudden increase in pelvic pain.     This information is not intended to replace advice given to you by your health care provider. Make sure you discuss any questions you have with your health care provider.     Document Released: 07/01/2010 Document Revised: 06/19/2014 Document Reviewed: 11/25/2013  Elsevier Interactive Patient Education ©2016 [employer]

## 2015-08-17 NOTE — MAU Provider Note (Signed)
History     CSN: [id number]  Arrival date and time: 08/17/15 2119   First Provider Initiated Contact with Patient 08/17/15 2150      Chief Complaint  Patient presents with  . Abdominal Cramping   HPI Ms. Consepcion Nguyen is a 30 y.o. G3P0030 who presents to MAU today with complaint of lower abdominal cramping. The patient states that this is a common "issue" with her cycles. She was seen in MAU with the same issue in November. US showed a septate complex cyst on the left as well as small fibroids. The patient was given Depo provera and instructed to follow-up with [facility] in 4-6 weeks for Nexplanon insertion. She missed that appointment. She states that this episode of bleeding started today. She had mild cramping last night, but much worse today. She has tried Tylenol and Motrin without relief. She states bleeding is light to moderate requiring 4 pads today.    OB History    Gravida Para Term Preterm AB TAB SAB Ectopic Multiple Living   3    3  3          No past medical history on file.  Past Surgical History  Procedure Laterality Date  . Hernia repair      No family history on file.  Social History  Substance Use Topics  . Smoking status: Former Smoker -- 0.30 packs/day    Types: Cigarettes    Quit date: 11/10/2012  . Smokeless tobacco: Not on file  . Alcohol Use: Yes     Comment: occasionally    Allergies:  Allergies  Allergen Reactions  . Antihistamines, Diphenhydramine-Type Other (See Comments)    Causes skin to burn  . Benadryl [Diphenhydramine Hcl] Other (See Comments)    Causes skin to burn    Prescriptions prior to admission  Medication Sig Dispense Refill Last Dose  . cyclobenzaprine (FLEXERIL) 5 MG tablet Take 5 mg by mouth once.   05/04/2015 at Unknown time  . ibuprofen (ADVIL,MOTRIN) 200 MG tablet Take 800 mg by mouth every 6 (six) hours as needed for mild pain or moderate pain.    05/04/2015 at Unknown time  . metroNIDAZOLE (FLAGYL) 500 MG tablet Take  1 tablet (500 mg total) by mouth 2 (two) times daily. 14 tablet 0   . [DISCONTINUED] oxyCODONE-acetaminophen (PERCOCET/ROXICET) 5-325 MG tablet Take 1-2 tablets by mouth every 6 (six) hours as needed. 15 tablet 0     Review of Systems  Constitutional: Negative for fever and malaise/fatigue.  Gastrointestinal: Positive for abdominal pain. Negative for nausea, vomiting, diarrhea and constipation.  Genitourinary:       + vaginal bleeding   Physical Exam   Blood pressure 116/78, pulse 81, temperature 98.3 F (36.8 C), temperature source Oral, resp. rate 16, last menstrual period 08/17/2015, SpO2 98 %.  Physical Exam  Nursing note and vitals reviewed. Constitutional: She is oriented to person, place, and time. She appears well-developed and well-nourished. No distress.  HENT:  Head: Normocephalic and atraumatic.  Cardiovascular: Normal rate.   Respiratory: Effort normal.  GI: Soft. She exhibits no distension and no mass. There is no tenderness. There is no rebound and no guarding.  Genitourinary: Right adnexum displays tenderness (mild) and fullness. Right adnexum displays no mass. Left adnexum displays no mass, no tenderness and no fullness. There is bleeding in the vagina.  Small amount of pad  Neurological: She is alert and oriented to person, place, and time.  Skin: Skin is warm and dry. No erythema.  Psychiatric: She has a normal mood and affect.    Results for orders placed or performed during the hospital encounter of 08/17/15 (from the past 24 hour(s))  Urinalysis, Routine w reflex microscopic (not at [facility])     Status: Abnormal   Collection Time: 08/17/15  9:30 PM  Result Value Ref Range   Color, Urine YELLOW YELLOW   APPearance CLEAR CLEAR   Specific Gravity, Urine >1.030 (H) 1.005 - 1.030   pH 5.5 5.0 - 8.0   Glucose, UA NEGATIVE NEGATIVE mg/dL   Hgb urine dipstick LARGE (A) NEGATIVE   Bilirubin Urine NEGATIVE NEGATIVE   Ketones, ur NEGATIVE NEGATIVE mg/dL   Protein, ur  30 (A) NEGATIVE mg/dL   Nitrite NEGATIVE NEGATIVE   Leukocytes, UA NEGATIVE NEGATIVE  Urine microscopic-add on     Status: Abnormal   Collection Time: 08/17/15  9:30 PM  Result Value Ref Range   Squamous Epithelial / LPF 0-5 (A) NONE SEEN   WBC, UA 0-5 0 - 5 WBC/hpf   RBC / HPF 6-30 0 - 5 RBC/hpf   Bacteria, UA MANY (A) NONE SEEN  Pregnancy, urine POC     Status: None   Collection Time: 08/17/15  9:53 PM  Result Value Ref Range   Preg Test, Ur NEGATIVE NEGATIVE   US Transvaginal Non-ob  08/17/2015  CLINICAL DATA:  Pelvic pain beginning yesterday with bleeding today. History of ovarian cysts and history of fibroids. EXAM: TRANSABDOMINAL AND TRANSVAGINAL ULTRASOUND OF PELVIS TECHNIQUE: Both transabdominal and transvaginal ultrasound examinations of the pelvis were performed. Transabdominal technique was performed for global imaging of the pelvis including uterus, ovaries, adnexal regions, and pelvic cul-de-sac. It was necessary to proceed with endovaginal exam following the transabdominal exam to visualize the uterus and ovaries. COMPARISON:  05/04/2015 FINDINGS: Uterus Measurements: 8.8 x 4 x 4.6 cm. Uterus is anteverted. There is in a somewhat exophytic fibroid arising from the uterine fundus measuring about 2 cm maximal diameter. Small myometrial fibroids to the right, measuring 9 mm and 1.7 cm, respectively. Similar appearance to previous study. Endometrium Thickness: 4.5 mm.  No focal abnormality visualized. Right ovary Measurements: 3.5 by 3.4 x 2.5 cm. Simple appearing cysts measuring 3 cm maximal diameter. This is new since previous study and probably represents a functional cyst. No abnormal adnexal masses. Left ovary Measurements: 2.7 x 1.6 x 2.5 cm. Normal appearance/no adnexal mass. Complex cyst seen previously has resolved suggesting a resolving hemorrhagic cyst. Other findings No abnormal free fluid. IMPRESSION: Multiple uterine fibroids similar to previous study. Interval resolution of  previous left ovarian complex cyst. Simple cysts in the right ovary is likely functional. No abnormal adnexal masses. Electronically Signed   By: Lucienne Capers M.D.   On: 08/17/2015 23:17   US Pelvis Complete  08/17/2015  CLINICAL DATA:  Pelvic pain beginning yesterday with bleeding today. History of ovarian cysts and history of fibroids. EXAM: TRANSABDOMINAL AND TRANSVAGINAL ULTRASOUND OF PELVIS TECHNIQUE: Both transabdominal and transvaginal ultrasound examinations of the pelvis were performed. Transabdominal technique was performed for global imaging of the pelvis including uterus, ovaries, adnexal regions, and pelvic cul-de-sac. It was necessary to proceed with endovaginal exam following the transabdominal exam to visualize the uterus and ovaries. COMPARISON:  05/04/2015 FINDINGS: Uterus Measurements: 8.8 x 4 x 4.6 cm. Uterus is anteverted. There is in a somewhat exophytic fibroid arising from the uterine fundus measuring about 2 cm maximal diameter. Small myometrial fibroids to the right, measuring 9 mm and 1.7 cm, respectively. Similar appearance to  previous study. Endometrium Thickness: 4.5 mm.  No focal abnormality visualized. Right ovary Measurements: 3.5 by 3.4 x 2.5 cm. Simple appearing cysts measuring 3 cm maximal diameter. This is new since previous study and probably represents a functional cyst. No abnormal adnexal masses. Left ovary Measurements: 2.7 x 1.6 x 2.5 cm. Normal appearance/no adnexal mass. Complex cyst seen previously has resolved suggesting a resolving hemorrhagic cyst. Other findings No abnormal free fluid. IMPRESSION: Multiple uterine fibroids similar to previous study. Interval resolution of previous left ovarian complex cyst. Simple cysts in the right ovary is likely functional. No abnormal adnexal masses. Electronically Signed   By: Lucienne Capers M.D.   On: 08/17/2015 23:17    MAU Course  Procedures None  MDM UPT - negative UA and [place] today 1 mg Dilaudid given for  pain in MAU Patient reports significant improvement in pain Assessment and Plan  A: Dysmenorrhea Simple right ovarian cyst   P: Discharge home Rx for Percocet given to patient Bleeding precautions discussed Patient advised to follow-up with [facility]. They will call her with an appointment in 2-3 weeks to initiate birth control for bleeding/pain management Patient may return to MAU as needed or if her condition were to change or worsen   Luvenia Redden, PA-C  08/17/2015, 11:31 PM

## 2015-09-06 ENCOUNTER — Encounter: Payer: Self-pay | Admitting: Obstetrics & Gynecology

## 2016-02-17 ENCOUNTER — Inpatient Hospital Stay (HOSPITAL_COMMUNITY)
Admission: AD | Admit: 2016-02-17 | Discharge: 2016-02-17 | Disposition: A | Discharge status: Home / Self Care | Payer: Self-pay | Source: Ambulatory Visit | Attending: Obstetrics and Gynecology | Admitting: Obstetrics and Gynecology

## 2016-02-17 ENCOUNTER — Encounter (HOSPITAL_COMMUNITY): Payer: Self-pay | Admitting: *Deleted

## 2016-02-17 DIAGNOSIS — N946 Dysmenorrhea, unspecified: Secondary | ICD-10-CM | POA: Insufficient documentation

## 2016-02-17 DIAGNOSIS — Z87891 Personal history of nicotine dependence: Secondary | ICD-10-CM | POA: Insufficient documentation

## 2016-02-17 HISTORY — DX: Other specified health status: Z78.9

## 2016-02-17 LAB — URINALYSIS, ROUTINE W REFLEX MICROSCOPIC
Bilirubin Urine: NEGATIVE
Glucose, UA: NEGATIVE mg/dL
Ketones, ur: NEGATIVE mg/dL
LEUKOCYTES UA: NEGATIVE
NITRITE: POSITIVE — AB
PROTEIN: NEGATIVE mg/dL
pH: 5.5 (ref 5.0–8.0)

## 2016-02-17 LAB — URINE MICROSCOPIC-ADD ON

## 2016-02-17 LAB — POCT PREGNANCY, URINE: PREG TEST UR: NEGATIVE

## 2016-02-17 MED ORDER — TRAMADOL HCL 50 MG PO TABS: 50.0000 mg | ORAL_TABLET | Freq: Four times a day (QID) | ORAL | 0 refills | Status: DC | PRN

## 2016-02-17 MED ORDER — KETOROLAC TROMETHAMINE 30 MG/ML IJ SOLN
30.0000 mg | Freq: Once | INTRAMUSCULAR | Status: AC
  Administered 2016-02-17: 30 mg via INTRAMUSCULAR
  Filled 2016-02-17: qty 1

## 2016-02-17 MED ORDER — PROMETHAZINE HCL 25 MG PO TABS
25.0000 mg | ORAL_TABLET | Freq: Once | ORAL | Status: AC
  Administered 2016-02-17: 25 mg via ORAL
  Filled 2016-02-17: qty 1

## 2016-02-17 MED ORDER — NORGESTIMATE-ETH ESTRADIOL 0.25-35 MG-MCG PO TABS: 1.0000 | ORAL_TABLET | Freq: Every day | ORAL | 4 refills | Status: DC

## 2016-02-17 NOTE — Discharge Instructions (Signed)

## 2016-02-17 NOTE — MAU Provider Note (Addendum)
History     CSN: [id number]  Arrival date and time: 02/17/16 [id number]   First Provider Initiated Contact with Patient 02/17/16 1033      Chief Complaint  Patient presents with  . Dysmenorrhea   HPI  30 yo G0 presenting today for the evaluation of painful menses. Patient describes onset of the pain yesterday evening which worsened this morning. She reports severe cramping pain which migrates to her mid back and her lower extremities. She reports 3 episodes of emesis secondary to the pain this morning. She reports regular monthly cycles. Her vaginal bleeding last 4-5 days with the first day associated with dysmenorrhea. She is typically able to manage her dysmenorrhea with ibuprofen and tylenol. Patient is sexually active without contraception. She is not actively seeking pregnancy but would welcome one if it occurred. She was previously on depo-provera but discontinued it secondary to severe weight loss. She was scheduled to have Nexplanon [facility] with the [facility] but was not aware of that appointment.   Past Medical History:  Diagnosis Date  . Medical history non-contributory     Past Surgical History:  Procedure Laterality Date  . HERNIA REPAIR      History reviewed. No pertinent family history.  Social History  Substance Use Topics  . Smoking status: Former Smoker    Packs/day: 0.30    Types: Cigarettes    Quit date: 11/10/2012  . Smokeless tobacco: Never Used  . Alcohol use Yes     Comment: occasionally    Allergies:  Allergies  Allergen Reactions  . Antihistamines, Diphenhydramine-Type Other (See Comments)    Causes skin to burn  . Benadryl [Diphenhydramine Hcl] Other (See Comments)    Causes skin to burn    Prescriptions Prior to Admission  Medication Sig Dispense Refill Last Dose  . ibuprofen (ADVIL,MOTRIN) 200 MG tablet Take 800 mg by mouth every 6 (six) hours as needed for mild pain or moderate pain.    02/17/2016 at Unknown time  . metroNIDAZOLE  (FLAGYL) 500 MG tablet Take 1 tablet (500 mg total) by mouth 2 (two) times daily. (Patient not taking: Reported on 02/17/2016) 14 tablet 0 Completed Course at Unknown time  . oxyCODONE-acetaminophen (PERCOCET/ROXICET) 5-325 MG tablet Take 1-2 tablets by mouth every 6 (six) hours as needed. (Patient not taking: Reported on 02/17/2016) 12 tablet 0 Not Taking at Unknown time    ROS  See pertinent in HPI Physical Exam   Blood pressure 138/87, pulse 73, temperature 99.2 F (37.3 C), temperature source Oral, resp. rate 18, height 4\' 9"  (1.448 m), weight 110 lb (49.9 kg), last menstrual period 02/16/2016.  Physical Exam GENERAL: Well-developed, well-nourished female in no acute distress.  LUNGS: Clear to auscultation bilaterally.  HEART: Regular rate and rhythm. ABDOMEN: Soft, mildly tender, nondistended.  PELVIC: Normal external female genitalia. Vagina is pink and rugated.  Normal discharge with small amount of blood in vault. Normal appearing cervix. Uterus is normal in size. No adnexal mass or tenderness. EXTREMITIES: No cyanosis, clubbing, or edema, 2+ distal pulses.  MAU Course  Procedures  MDM 30 mg Toradol and 25 mg phenergan UA positive nitrite sent for culture Pregnancy test is negative Patient reports significant improvement and agrees with discharge plan   Assessment and Plan  30 yo G0 with dysmenorrhea - Discussed medical management with contraception. Rx Sprintec provided  - Patient will be scheduled at [facility] for follow up and further management of her pain in 2-3 months - Patient will be contacted with Urine  culture results - RTC to MAU as needed  Sonya Nguyen 02/17/2016, 10:40 AM

## 2016-02-17 NOTE — MAU Note (Signed)
Pt reports she normally has painful menstrual cramping. Last few month ok. Last night they started and now she is vomiting about 2-3 times.

## 2016-02-19 LAB — URINE CULTURE: Special Requests: NORMAL

## 2016-02-20 ENCOUNTER — Telehealth: Payer: Self-pay | Admitting: Certified Nurse Midwife

## 2016-02-20 DIAGNOSIS — N390 Urinary tract infection, site not specified: Secondary | ICD-10-CM | POA: Insufficient documentation

## 2016-02-20 MED ORDER — NITROFURANTOIN MONOHYD MACRO 100 MG PO CAPS: 100.0000 mg | ORAL_CAPSULE | Freq: Two times a day (BID) | ORAL | 0 refills | Status: AC

## 2016-02-20 NOTE — Telephone Encounter (Signed)
UC positive for UTI. Notified pt of findings. Instructed to increase water intake and complete all abx. Rx for Macrobid. Verbalized understanding.

## 2016-02-21 ENCOUNTER — Telehealth: Payer: Self-pay | Admitting: General Practice

## 2016-02-21 NOTE — Telephone Encounter (Signed)
Opened in error

## 2016-03-09 ENCOUNTER — Encounter: Payer: Self-pay | Admitting: Obstetrics & Gynecology

## 2016-05-01 ENCOUNTER — Encounter: Payer: Self-pay | Admitting: Obstetrics & Gynecology

## 2016-05-01 ENCOUNTER — Telehealth: Payer: Self-pay | Admitting: General Practice

## 2016-05-01 NOTE — Telephone Encounter (Signed)
Patient no showed for appt today. Called patient and she states she completely forgot and would like appt rescheduled. Told patient someone from the front office will contact her to reschedule. Patient had no questions

## 2016-05-10 ENCOUNTER — Inpatient Hospital Stay (HOSPITAL_COMMUNITY)
Admission: AD | Admit: 2016-05-10 | Discharge: 2016-05-10 | Disposition: A | Discharge status: Home / Self Care | Payer: Self-pay | Source: Ambulatory Visit | Attending: Obstetrics & Gynecology | Admitting: Obstetrics & Gynecology

## 2016-05-10 ENCOUNTER — Encounter (HOSPITAL_COMMUNITY): Payer: Self-pay | Admitting: *Deleted

## 2016-05-10 DIAGNOSIS — N946 Dysmenorrhea, unspecified: Secondary | ICD-10-CM

## 2016-05-10 DIAGNOSIS — Z3202 Encounter for pregnancy test, result negative: Secondary | ICD-10-CM | POA: Insufficient documentation

## 2016-05-10 DIAGNOSIS — Z87891 Personal history of nicotine dependence: Secondary | ICD-10-CM | POA: Insufficient documentation

## 2016-05-10 LAB — URINE MICROSCOPIC-ADD ON

## 2016-05-10 LAB — URINALYSIS, ROUTINE W REFLEX MICROSCOPIC
Bilirubin Urine: NEGATIVE
Glucose, UA: NEGATIVE mg/dL
Ketones, ur: 15 mg/dL — AB
Leukocytes, UA: NEGATIVE
Nitrite: NEGATIVE
PROTEIN: NEGATIVE mg/dL
Specific Gravity, Urine: >1.03 — ABNORMAL HIGH (ref 1.005–1.030)
pH: 5.5 (ref 5.0–8.0)

## 2016-05-10 LAB — RAPID URINE DRUG SCREEN, HOSP PERFORMED
AMPHETAMINES: NOT DETECTED
Barbiturates: NOT DETECTED
Benzodiazepines: NOT DETECTED
Cocaine: NOT DETECTED
OPIATES: NOT DETECTED
Tetrahydrocannabinol: POSITIVE — AB

## 2016-05-10 LAB — POCT PREGNANCY, URINE: Preg Test, Ur: NEGATIVE

## 2016-05-10 MED ORDER — LORAZEPAM 2 MG/ML IJ SOLN
1.0000 mg | Freq: Once | INTRAMUSCULAR | Status: AC
  Administered 2016-05-10: 1 mg via INTRAMUSCULAR
  Filled 2016-05-10: qty 0.5

## 2016-05-10 MED ORDER — NORGESTIMATE-ETH ESTRADIOL 0.25-35 MG-MCG PO TABS: 1.0000 | ORAL_TABLET | Freq: Every day | ORAL | 11 refills | Status: DC

## 2016-05-10 MED ORDER — TRAMADOL HCL 50 MG PO TABS: 50.0000 mg | ORAL_TABLET | Freq: Four times a day (QID) | ORAL | 0 refills | Status: DC | PRN

## 2016-05-10 MED ORDER — KETOROLAC TROMETHAMINE 60 MG/2ML IM SOLN
60.0000 mg | Freq: Once | INTRAMUSCULAR | Status: AC
  Administered 2016-05-10: 60 mg via INTRAMUSCULAR
  Filled 2016-05-10: qty 2

## 2016-05-10 MED ORDER — IBUPROFEN 800 MG PO TABS: 800.0000 mg | ORAL_TABLET | Freq: Three times a day (TID) | ORAL | 6 refills | Status: DC | PRN

## 2016-05-10 NOTE — MAU Note (Signed)
Dr. Harolyn Rutherford at bedside with pt to assess and discuss plan of care. Pt states she didn't want the "shot that burns." Dr. Harolyn Rutherford explained that this is the best plan of care of this type of pain. Pt agrees to toradol injection. Pt continuing to yell and scream while at bedside.

## 2016-05-10 NOTE — MAU Note (Signed)
Dr. Harolyn Rutherford at bedside to reassess pt. Pt no longer screaming and agrees to plan of care and discharge home.

## 2016-05-10 NOTE — MAU Note (Signed)
Pt offered IM injection of ativan per MD order. Explained purpose of medication. Pt screaming "you just want to put me to sleep." Reassured pt that RN is attempting to help patient and make her feel better. Asked pt if she did not want the medicaiton. Pt screamed at RN "I want the medication." IM injection given.

## 2016-05-10 NOTE — Discharge Instructions (Signed)
Dysmenorrhea °Menstrual cramps (dysmenorrhea) are caused by the muscles of the uterus tightening (contracting) during a menstrual period. For some women, this discomfort is merely bothersome. For others, dysmenorrhea can be severe enough to interfere with everyday activities for a few days each month. °Primary dysmenorrhea is menstrual cramps that last a couple of days when you start having menstrual periods or soon after. This often begins after a teenager starts having her period. As a woman gets older or has a baby, the cramps will usually lessen or disappear. Secondary dysmenorrhea begins later in life, lasts longer, and the pain may be stronger than primary dysmenorrhea. The pain may start before the period and last a few days after the period. °What are the causes? °Dysmenorrhea is usually caused by an underlying problem, such as: °· The tissue lining the uterus grows outside of the uterus in other areas of the body (endometriosis). °· The endometrial tissue, which normally lines the uterus, is found in or grows into the muscular walls of the uterus (adenomyosis). °· The pelvic blood vessels are engorged with blood just before the menstrual period (pelvic congestive syndrome). °· Overgrowth of cells (polyps) in the lining of the uterus or cervix. °· Falling down of the uterus (prolapse) because of loose or stretched ligaments. °· Depression. °· Bladder problems, infection, or inflammation. °· Problems with the intestine, a tumor, or irritable bowel syndrome. °· Cancer of the female organs or bladder. °· A severely tipped uterus. °· A very tight opening or closed cervix. °· Noncancerous tumors of the uterus (fibroids). °· Pelvic inflammatory disease (PID). °· Pelvic scarring (adhesions) from a previous surgery. °· Ovarian cyst. °· An intrauterine device (IUD) used for birth control. °What increases the risk? °You may be at greater risk of dysmenorrhea if: °· You are younger than age 30. °· You started puberty  early. °· You have irregular or heavy bleeding. °· You have never given birth. °· You have a family history of this problem. °· You are a smoker. °What are the signs or symptoms? °· Cramping or throbbing pain in your lower abdomen. °· Headaches. °· Lower back pain. °· Nausea or vomiting. °· Diarrhea. °· Sweating or dizziness. °· Loose stools. °How is this diagnosed? °A diagnosis is based on your history, symptoms, physical exam, diagnostic tests, or procedures. Diagnostic tests or procedures may include: °· Blood tests. °· Ultrasonography. °· An examination of the lining of the uterus (dilation and curettage, D&C). °· An examination inside your abdomen or pelvis with a scope (laparoscopy). °· X-rays. °· CT scan. °· MRI. °· An examination inside the bladder with a scope (cystoscopy). °· An examination inside the intestine or stomach with a scope (colonoscopy, gastroscopy). °How is this treated? °Treatment depends on the cause of the dysmenorrhea. Treatment may include: °· Pain medicine prescribed by your health care provider. °· Birth control pills or an IUD with progesterone hormone in it. °· Hormone replacement therapy. °· Nonsteroidal anti-inflammatory drugs (NSAIDs). These may help stop the production of prostaglandins. °· Surgery to remove adhesions, endometriosis, ovarian cyst, or fibroids. °· Removal of the uterus (hysterectomy). °· Progesterone shots to stop the menstrual period. °· Cutting the nerves on the sacrum that go to the female organs (presacral neurectomy). °· Electric current to the sacral nerves (sacral nerve stimulation). °· Antidepressant medicine. °· Psychiatric therapy, counseling, or group therapy. °· Exercise and physical therapy. °· Meditation and yoga therapy. °· Acupuncture. °Follow these instructions at home: °· Only take over-the-counter or prescription medicines as directed   by your health care provider. °· Place a heating pad or hot water bottle on your lower back or abdomen. Do not  sleep with the heating pad. °· Use aerobic exercises, walking, swimming, biking, and other exercises to help lessen the cramping. °· Massage to the lower back or abdomen may help. °· Stop smoking. °· Avoid alcohol and caffeine. °Contact a health care provider if: °· Your pain does not get better with medicine. °· You have pain with sexual intercourse. °· Your pain increases and is not controlled with medicines. °· You have abnormal vaginal bleeding with your period. °· You develop nausea or vomiting with your period that is not controlled with medicine. °Get help right away if: °You pass out. °This information is not intended to replace advice given to you by your health care provider. Make sure you discuss any questions you have with your health care provider. °Document Released: 05/29/2005 Document Revised: 11/04/2015 Document Reviewed: 11/14/2012 °Elsevier Interactive Patient Education © 2017 [employer] ° °

## 2016-05-10 NOTE — MAU Note (Signed)
Once in triage pt is screaming and yelling and then stating she is tired. States she cannot stand or sit . Another RN comes in to assist with a cath u/a , procedure explained to pt. Once cath is completed pt refuses to put her jeans back on.pt states we are laughing at her and she is going to file a complaint. Reassured pt that we are taking her pain seriously and that I brought her straight into a room and that we are trying to help her. Reassured pt the MD is coming into triage to see her. Informed pt we will have someone see her prior to discharge so she can file her complaint.

## 2016-05-10 NOTE — MAU Provider Note (Signed)
[facility] MAU Attending Note  History     Chief Complaint  Patient presents with  . Dysmenorrhea    Sonya Nguyen is a 30 y.o. G3P0030 non-pregnant patient who presents to MAU today for evaluation of severe dysmenorrhea x 2 days.  Period started 2 days ago, and is associated with severe pain in lower abdomen, radiating to back and down her legs. Patient was screaming during triage process and was heard throughout MAU screaming.  Initially refused examination and refused to give urine sample; urine catherization was done in triage to get labs.  She was also very belligerent towards staff.  On encounter with me, she denies any abnormal vaginal discharge, fevers, chills, sweats, dysuria, nausea, vomiting, other GI or GU symptoms or other general symptoms.   Obstetric History   G3   P0   T0   P0   A3   L0    SAB3   TAB0   Ectopic0   Multiple0   Live Births0     # Outcome Date GA Lbr Len/2nd Weight Sex Delivery Anes PTL Lv  3 SAB           2 SAB           1 SAB               Past Medical History:  Diagnosis Date  . Medical history non-contributory     Past Surgical History:  Procedure Laterality Date  . HERNIA REPAIR      No family history on file.  Social History  Substance Use Topics  . Smoking status: Former Smoker    Packs/day: 0.30    Types: Cigarettes    Quit date: 11/10/2012  . Smokeless tobacco: Never Used  . Alcohol use Yes     Comment: occasionally    Allergies  Allergen Reactions  . Antihistamines, Diphenhydramine-Type Other (See Comments)    Causes skin to burn  . Benadryl [Diphenhydramine Hcl] Other (See Comments)    Causes skin to burn    No prescriptions prior to admission.     Physical Exam  BP 124/66   Pulse 80   Temp 98.1 F (36.7 C) (Oral)   Resp 18   LMP 05/09/2016   SpO2 99%  GENERAL: Well-developed, well-nourished female in no acute distress  SKIN: Warm, dry and without erythema PSYCH: Normal mood and affect HEENT:  Normocephalic, atraumatic.   LUNGS: Normal respiratory effort, normal breath sounds HEART: Regular rate noted ABDOMEN: Soft, nondistended, +diffuse moderate tenderness in lower abdomen, no rebound or guarding. PELVIC: Scant blood on pad EXTREMITIES: No edema, no cyanosis, normal range of movement  MAU Course/MDM  Toradol 60 mg IM x 1 and Ativan 1 mg IM x 1 given Pain improved  Labs and Imaging   Results for orders placed or performed during the hospital encounter of 05/10/16 (from the past 24 hour(s))  Urinalysis, Routine w reflex microscopic (not at [facility])     Status: Abnormal   Collection Time: 05/10/16  9:00 PM  Result Value Ref Range   Color, Urine YELLOW YELLOW   APPearance CLEAR CLEAR   Specific Gravity, Urine >1.030 (H) 1.005 - 1.030   pH 5.5 5.0 - 8.0   Glucose, UA NEGATIVE NEGATIVE mg/dL   Hgb urine dipstick SMALL (A) NEGATIVE   Bilirubin Urine NEGATIVE NEGATIVE   Ketones, ur 15 (A) NEGATIVE mg/dL   Protein, ur NEGATIVE NEGATIVE mg/dL   Nitrite NEGATIVE NEGATIVE   Leukocytes, UA NEGATIVE NEGATIVE  Urine microscopic-add on     Status: Abnormal   Collection Time: 05/10/16  9:00 PM  Result Value Ref Range   Squamous Epithelial / LPF 0-5 (A) NONE SEEN   WBC, UA 0-5 0 - 5 WBC/hpf   RBC / HPF 0-5 0 - 5 RBC/hpf   Bacteria, UA RARE (A) NONE SEEN   Urine-Other MUCOUS PRESENT   Pregnancy, urine POC     Status: None   Collection Time: 05/10/16  9:01 PM  Result Value Ref Range   Preg Test, Ur NEGATIVE NEGATIVE  Urine rapid drug screen (hosp performed)     Status: Abnormal   Collection Time: 05/10/16  9:04 PM  Result Value Ref Range   Opiates NONE DETECTED NONE DETECTED   Cocaine NONE DETECTED NONE DETECTED   Benzodiazepines NONE DETECTED NONE DETECTED   Amphetamines NONE DETECTED NONE DETECTED   Tetrahydrocannabinol POSITIVE (A) NONE DETECTED   Barbiturates NONE DETECTED NONE DETECTED   No results found.  Assessment and Plan   1. Dysmenorrhea    Prescribed  Ibuprofen and Toradol as needed.  OCPs also recommended and ordered. Outpatient ultrasound ordered. Was told to return to MAU for any pain, bleeding or other concerns, or if her condition were to change or worsen. Discharged to home in stable condition   Follow-up [facility] Follow up on 05/23/2016.   Why:  For appointment as scheduled Contact information: [address] [phone]           Medication List    TAKE these medications   ibuprofen 800 MG tablet Commonly known as:  ADVIL,MOTRIN Take 1 tablet (800 mg total) by mouth 3 (three) times daily with meals as needed for mild pain or moderate pain. What changed:  medication strength  when to take this   norgestimate-ethinyl estradiol 0.25-35 MG-MCG tablet Commonly known as:  ORTHO-CYCLEN,SPRINTEC,PREVIFEM Take 1 tablet by mouth daily.   traMADol 50 MG tablet Commonly known as:  ULTRAM Take 1 tablet (50 mg total) by mouth every 6 (six) hours as needed for severe pain. What changed:  reasons to take this        Verita Schneiders, MD, [place] Attending [place], [facility] for [place]

## 2016-05-10 NOTE — MAU Note (Signed)
Medication explained to patient before administering. Pt verbalized understanding and desire for Toradol to help with pain. Pt screaming while attempting to administer medication. IM medication given. Pt continues to scream.

## 2016-05-10 NOTE — MAU Note (Signed)
Pt states she normally has really bad cramps but she started bleeding 2 days ago and the pain is really bad.

## 2016-05-10 NOTE — MAU Note (Deleted)
Medication explained to patient before administering. Pt verbalized understanding and desire for Toradol to help with pain. Pt screaming while attempting to administer medication. IM medication given. Pt continues to scream.

## 2016-05-10 NOTE — MAU Note (Signed)
[place] RN house coverage at bedside to discuss complaint with patient.

## 2016-05-23 ENCOUNTER — Encounter: Payer: Self-pay | Admitting: Obstetrics & Gynecology

## 2016-06-08 ENCOUNTER — Inpatient Hospital Stay (HOSPITAL_COMMUNITY)
Admission: AD | Admit: 2016-06-08 | Discharge: 2016-06-09 | Disposition: A | Discharge status: Home / Self Care | Payer: Self-pay | Source: Ambulatory Visit | Attending: Obstetrics & Gynecology | Admitting: Obstetrics & Gynecology

## 2016-06-08 ENCOUNTER — Encounter (HOSPITAL_COMMUNITY): Payer: Self-pay | Admitting: *Deleted

## 2016-06-08 DIAGNOSIS — N946 Dysmenorrhea, unspecified: Secondary | ICD-10-CM | POA: Insufficient documentation

## 2016-06-08 DIAGNOSIS — N939 Abnormal uterine and vaginal bleeding, unspecified: Secondary | ICD-10-CM | POA: Insufficient documentation

## 2016-06-08 DIAGNOSIS — Z87891 Personal history of nicotine dependence: Secondary | ICD-10-CM | POA: Insufficient documentation

## 2016-06-08 DIAGNOSIS — R112 Nausea with vomiting, unspecified: Secondary | ICD-10-CM | POA: Insufficient documentation

## 2016-06-08 LAB — POCT PREGNANCY, URINE: Preg Test, Ur: NEGATIVE

## 2016-06-08 MED ORDER — KETOROLAC TROMETHAMINE 60 MG/2ML IM SOLN
60.0000 mg | Freq: Once | INTRAMUSCULAR | Status: AC
  Administered 2016-06-08: 60 mg via INTRAMUSCULAR
  Filled 2016-06-08: qty 2

## 2016-06-08 MED ORDER — ONDANSETRON 8 MG PO TBDP
8.0000 mg | ORAL_TABLET | Freq: Once | ORAL | Status: AC
  Administered 2016-06-08: 8 mg via ORAL
  Filled 2016-06-08: qty 1

## 2016-06-08 NOTE — MAU Provider Note (Signed)
History     CSN: [id number]  Arrival date and time: 06/08/16 2236   First Provider Initiated Contact with Patient 06/08/16 2327      Chief Complaint  Patient presents with  . Vaginal Bleeding   Sonya Nguyen is a 29 y.o. G3P0030 who presents today with menstrual cramps and nausea/vomiting. She states that every few months she has a very painful period and has to come here for pain medication. She states that this is not out of the ordinary for her. She is interested in birth control to help however, she missed her appointment in the clinic.    Vaginal Bleeding  The patient's primary symptoms include pelvic pain and vaginal bleeding. This is a new problem. The current episode started today. The problem occurs constantly. The problem has been unchanged. Pain severity now: 10/10. The problem affects both sides. She is not pregnant. Associated symptoms include abdominal pain, nausea and vomiting. Pertinent negatives include no chills, constipation, diarrhea, dysuria, fever, frequency or urgency. The vaginal bleeding is lighter than menses. She has been passing clots. She has not been passing tissue. Nothing aggravates the symptoms. She has tried NSAIDs and acetaminophen (vomited after taking ) for the symptoms. The treatment provided no relief. She uses nothing for contraception. Her menstrual history has been regular (LMP 06/08/16 ).   Past Medical History:  Diagnosis Date  . Medical history non-contributory     Past Surgical History:  Procedure Laterality Date  . HERNIA REPAIR      No family history on file.  Social History  Substance Use Topics  . Smoking status: Former Smoker    Packs/day: 0.30    Types: Cigarettes    Quit date: 11/10/2012  . Smokeless tobacco: Never Used  . Alcohol use Yes     Comment: occasionally    Allergies:  Allergies  Allergen Reactions  . Antihistamines, Diphenhydramine-Type Other (See Comments)    Causes skin to burn  . Benadryl  [Diphenhydramine Hcl] Other (See Comments)    Causes skin to burn    Prescriptions Prior to Admission  Medication Sig Dispense Refill Last Dose  . ibuprofen (ADVIL,MOTRIN) 800 MG tablet Take 1 tablet (800 mg total) by mouth 3 (three) times daily with meals as needed for mild pain or moderate pain. 30 tablet 6   . norgestimate-ethinyl estradiol (ORTHO-CYCLEN,SPRINTEC,PREVIFEM) 0.25-35 MG-MCG tablet Take 1 tablet by mouth daily. 1 Package 11   . traMADol (ULTRAM) 50 MG tablet Take 1 tablet (50 mg total) by mouth every 6 (six) hours as needed for severe pain. 15 tablet 0     Review of Systems  Constitutional: Negative for chills and fever.  Gastrointestinal: Positive for abdominal pain, nausea and vomiting. Negative for constipation and diarrhea.  Genitourinary: Positive for pelvic pain and vaginal bleeding. Negative for dysuria, frequency and urgency.   Physical Exam   Blood pressure 155/92, pulse (!) 54, temperature 98.5 F (36.9 C), temperature source Oral, resp. rate 20, height 4\' 9"  (1.448 m), weight 112 lb 4 oz (50.9 kg), last menstrual period 06/08/2016.  Physical Exam  Nursing note and vitals reviewed. Constitutional: She is oriented to person, place, and time. She appears well-developed and well-nourished. No distress.  HENT:  Head: Normocephalic.  Cardiovascular: Normal rate.   Respiratory: Effort normal.  GI: Soft. There is no tenderness. There is no rebound.  Neurological: She is alert and oriented to person, place, and time.  Skin: Skin is warm and dry.  Psychiatric: She has a normal mood and  affect.   Results for orders placed or performed during the hospital encounter of 06/08/16 (from the past 24 hour(s))  Pregnancy, urine POC     Status: None   Collection Time: 06/08/16 11:10 PM  Result Value Ref Range   Preg Test, Ur NEGATIVE NEGATIVE    MAU Course  Procedures  MDM Patient has had toradol and zofran. She is feeling better at this time.   Assessment and  Plan   1. Dysmenorrhea    DC home Comfort measures reviewed  RX: sprintec as directed #1 with 11RF  Return to MAU as needed FU with OB as planned  [facility] [place] Follow up.   Specialty:  Obstetrics and Gynecology Why:  Call for an appointment  Contact information: [place] [phone]           Mathis Bud 06/08/2016, 11:28 PM

## 2016-06-08 NOTE — MAU Note (Addendum)
PT  WAS HERE IN NOV-   NEG  UTP.   NOW  STARTED  CYCLE  TODAY   AT   9PM.     PAD ON IN TRIAGE     MOD   LIGHT    BLEEDING.        NAUSEA/  VOMITING    WITH CYCLE  ALL  TIME.      NO DR     HAD AN APPOINTMENT   ON 12-14-  WITH  CLINIC-     SOOO NO APPOINTMENT  - AND  DID NOT MAKE AN  APPOINTMENT .     PT   SAYS   THIS   IS  HER  CYCLE-  IN TRIAGE -    SPITTING  IN BAG   TOOK TYLENOL    REG STR-  1  TAB.    AT 9PM.       AT 930PM-    TOOK 1  TAB  OF IBUPROFEN    .

## 2016-06-09 DIAGNOSIS — N946 Dysmenorrhea, unspecified: Secondary | ICD-10-CM

## 2016-06-09 MED ORDER — NORGESTIMATE-ETH ESTRADIOL 0.25-35 MG-MCG PO TABS: 1.0000 | ORAL_TABLET | Freq: Every day | ORAL | 11 refills | Status: DC

## 2016-06-09 NOTE — Discharge Instructions (Signed)
Dysmenorrhea °Menstrual cramps (dysmenorrhea) are caused by the muscles of the uterus tightening (contracting) during a menstrual period. For some women, this discomfort is merely bothersome. For others, dysmenorrhea can be severe enough to interfere with everyday activities for a few days each month. °Primary dysmenorrhea is menstrual cramps that last a couple of days when you start having menstrual periods or soon after. This often begins after a teenager starts having her period. As a woman gets older or has a baby, the cramps will usually lessen or disappear. Secondary dysmenorrhea begins later in life, lasts longer, and the pain may be stronger than primary dysmenorrhea. The pain may start before the period and last a few days after the period. °What are the causes? °Dysmenorrhea is usually caused by an underlying problem, such as: °· The tissue lining the uterus grows outside of the uterus in other areas of the body (endometriosis). °· The endometrial tissue, which normally lines the uterus, is found in or grows into the muscular walls of the uterus (adenomyosis). °· The pelvic blood vessels are engorged with blood just before the menstrual period (pelvic congestive syndrome). °· Overgrowth of cells (polyps) in the lining of the uterus or cervix. °· Falling down of the uterus (prolapse) because of loose or stretched ligaments. °· Depression. °· Bladder problems, infection, or inflammation. °· Problems with the intestine, a tumor, or irritable bowel syndrome. °· Cancer of the female organs or bladder. °· A severely tipped uterus. °· A very tight opening or closed cervix. °· Noncancerous tumors of the uterus (fibroids). °· Pelvic inflammatory disease (PID). °· Pelvic scarring (adhesions) from a previous surgery. °· Ovarian cyst. °· An intrauterine device (IUD) used for birth control. °What increases the risk? °You may be at greater risk of dysmenorrhea if: °· You are younger than age 30. °· You started puberty  early. °· You have irregular or heavy bleeding. °· You have never given birth. °· You have a family history of this problem. °· You are a smoker. °What are the signs or symptoms? °· Cramping or throbbing pain in your lower abdomen. °· Headaches. °· Lower back pain. °· Nausea or vomiting. °· Diarrhea. °· Sweating or dizziness. °· Loose stools. °How is this diagnosed? °A diagnosis is based on your history, symptoms, physical exam, diagnostic tests, or procedures. Diagnostic tests or procedures may include: °· Blood tests. °· Ultrasonography. °· An examination of the lining of the uterus (dilation and curettage, D&C). °· An examination inside your abdomen or pelvis with a scope (laparoscopy). °· X-rays. °· CT scan. °· MRI. °· An examination inside the bladder with a scope (cystoscopy). °· An examination inside the intestine or stomach with a scope (colonoscopy, gastroscopy). °How is this treated? °Treatment depends on the cause of the dysmenorrhea. Treatment may include: °· Pain medicine prescribed by your health care provider. °· Birth control pills or an IUD with progesterone hormone in it. °· Hormone replacement therapy. °· Nonsteroidal anti-inflammatory drugs (NSAIDs). These may help stop the production of prostaglandins. °· Surgery to remove adhesions, endometriosis, ovarian cyst, or fibroids. °· Removal of the uterus (hysterectomy). °· Progesterone shots to stop the menstrual period. °· Cutting the nerves on the sacrum that go to the female organs (presacral neurectomy). °· Electric current to the sacral nerves (sacral nerve stimulation). °· Antidepressant medicine. °· Psychiatric therapy, counseling, or group therapy. °· Exercise and physical therapy. °· Meditation and yoga therapy. °· Acupuncture. °Follow these instructions at home: °· Only take over-the-counter or prescription medicines as directed   by your health care provider. °· Place a heating pad or hot water bottle on your lower back or abdomen. Do not  sleep with the heating pad. °· Use aerobic exercises, walking, swimming, biking, and other exercises to help lessen the cramping. °· Massage to the lower back or abdomen may help. °· Stop smoking. °· Avoid alcohol and caffeine. °Contact a health care provider if: °· Your pain does not get better with medicine. °· You have pain with sexual intercourse. °· Your pain increases and is not controlled with medicines. °· You have abnormal vaginal bleeding with your period. °· You develop nausea or vomiting with your period that is not controlled with medicine. °Get help right away if: °You pass out. °This information is not intended to replace advice given to you by your health care provider. Make sure you discuss any questions you have with your health care provider. °Document Released: 05/29/2005 Document Revised: 11/04/2015 Document Reviewed: 11/14/2012 °Elsevier Interactive Patient Education © 2017 [employer] ° °

## 2016-07-03 ENCOUNTER — Ambulatory Visit (HOSPITAL_COMMUNITY)
Admission: RE | Admit: 2016-07-03 | Discharge: 2016-07-03 | Disposition: A | Discharge status: Home / Self Care | Payer: Self-pay | Source: Ambulatory Visit | Attending: Obstetrics & Gynecology | Admitting: Obstetrics & Gynecology

## 2016-07-03 ENCOUNTER — Telehealth: Payer: Self-pay | Admitting: *Deleted

## 2016-07-03 DIAGNOSIS — N946 Dysmenorrhea, unspecified: Secondary | ICD-10-CM | POA: Insufficient documentation

## 2016-07-03 NOTE — Telephone Encounter (Signed)
Per message from Dr. Harolyn Rutherford I called Sonya Nguyen and notified her that her US done In MAU was normal pelvic scan except possible very small fibroids to small to measure. She voices understanding and no questions voiced.

## 2016-12-02 ENCOUNTER — Encounter (HOSPITAL_COMMUNITY): Payer: Self-pay | Admitting: Emergency Medicine

## 2016-12-02 ENCOUNTER — Emergency Department (HOSPITAL_COMMUNITY)
Admission: EM | Admit: 2016-12-02 | Discharge: 2016-12-02 | Disposition: A | Discharge status: Home / Self Care | Payer: Self-pay | Attending: Emergency Medicine | Admitting: Emergency Medicine

## 2016-12-02 DIAGNOSIS — R112 Nausea with vomiting, unspecified: Secondary | ICD-10-CM | POA: Insufficient documentation

## 2016-12-02 DIAGNOSIS — R102 Pelvic and perineal pain: Secondary | ICD-10-CM | POA: Insufficient documentation

## 2016-12-02 DIAGNOSIS — Z87891 Personal history of nicotine dependence: Secondary | ICD-10-CM | POA: Insufficient documentation

## 2016-12-02 LAB — URINALYSIS, ROUTINE W REFLEX MICROSCOPIC
BILIRUBIN URINE: NEGATIVE
Glucose, UA: NEGATIVE mg/dL
KETONES UR: 20 mg/dL — AB
Nitrite: POSITIVE — AB
PH: 5 (ref 5.0–8.0)
PROTEIN: 30 mg/dL — AB
Specific Gravity, Urine: 1.021 (ref 1.005–1.030)

## 2016-12-02 LAB — CBC
HEMATOCRIT: 39.8 % (ref 36.0–46.0)
HEMOGLOBIN: 13.7 g/dL (ref 12.0–15.0)
MCH: 32.7 pg (ref 26.0–34.0)
MCHC: 34.4 g/dL (ref 30.0–36.0)
MCV: 95 fL (ref 78.0–100.0)
PLATELETS: 260 10*3/uL (ref 150–400)
RBC: 4.19 MIL/uL (ref 3.87–5.11)
RDW: 13.1 % (ref 11.5–15.5)
WBC: 15.4 10*3/uL — AB (ref 4.0–10.5)

## 2016-12-02 LAB — COMPREHENSIVE METABOLIC PANEL
ALT: 17 U/L (ref 14–54)
ANION GAP: 13 (ref 5–15)
AST: 26 U/L (ref 15–41)
Albumin: 4.5 g/dL (ref 3.5–5.0)
Alkaline Phosphatase: 49 U/L (ref 38–126)
BUN: 10 mg/dL (ref 6–20)
CO2: 20 mmol/L — AB (ref 22–32)
Calcium: 9.2 mg/dL (ref 8.9–10.3)
Chloride: 106 mmol/L (ref 101–111)
Creatinine, Ser: 0.96 mg/dL (ref 0.44–1.00)
Glucose, Bld: 135 mg/dL — ABNORMAL HIGH (ref 65–99)
POTASSIUM: 3.2 mmol/L — AB (ref 3.5–5.1)
SODIUM: 139 mmol/L (ref 135–145)
Total Bilirubin: 0.7 mg/dL (ref 0.3–1.2)
Total Protein: 7.6 g/dL (ref 6.5–8.1)

## 2016-12-02 LAB — I-STAT BETA HCG BLOOD, ED (MC, WL, AP ONLY): I-stat hCG, quantitative: <5 m[IU]/mL (ref <5)

## 2016-12-02 LAB — LIPASE, BLOOD: LIPASE: 24 U/L (ref 11–51)

## 2016-12-02 MED ORDER — KETOROLAC TROMETHAMINE 60 MG/2ML IM SOLN
60.0000 mg | Freq: Once | INTRAMUSCULAR | Status: AC
  Administered 2016-12-02: 60 mg via INTRAMUSCULAR
  Filled 2016-12-02: qty 2

## 2016-12-02 MED ORDER — ONDANSETRON 4 MG PO TBDP
4.0000 mg | ORAL_TABLET | Freq: Once | ORAL | Status: AC
  Administered 2016-12-02: 4 mg via ORAL
  Filled 2016-12-02: qty 1

## 2016-12-02 MED ORDER — ONDANSETRON 4 MG PO TBDP
4.0000 mg | ORAL_TABLET | Freq: Once | ORAL | Status: AC | PRN
  Administered 2016-12-02: 4 mg via ORAL

## 2016-12-02 MED ORDER — ONDANSETRON 4 MG PO TBDP
ORAL_TABLET | ORAL | Status: AC
  Filled 2016-12-02: qty 1

## 2016-12-02 NOTE — ED Notes (Signed)
Pt called out and said she is feeling a little bit better and she needs to leave soon "I have curfew at 10pm so I need to be home by 10"...... Nurse notified

## 2016-12-02 NOTE — ED Notes (Signed)
ED Provider at bedside. 

## 2016-12-02 NOTE — ED Notes (Signed)
Pt still crying out in pain stating "the medication aint working". MD made aware.

## 2016-12-02 NOTE — ED Triage Notes (Signed)
Pt to ER for generalized abd pain onset this morning at 2 am with nausea and vomiting x5. Actively vomiting in triage. Reports hx of endometriosis, reports menstrual cycle is 1 week late also.

## 2016-12-02 NOTE — ED Notes (Signed)
Pt reports hx of endometriosis. Pt states she has pain in lower abdomen that moves to which ever side she lays on. Pt also c/o lower back pain. When asked when her last menstrual cycle was pt states "sometime last month, I'm a few days late this month". Pt vomiting in room reporting this is the seventh time in the last 24 hours. Offered pt heat packs for lower abdominal cramping.

## 2016-12-02 NOTE — ED Provider Notes (Signed)
[facility] DEPT Provider Note   CSN: [id number] Arrival date & time: 12/02/16  1743     History   Chief Complaint No chief complaint on file.   HPI Sonya Nguyen is a 31 y.o. female.  Patient is a 31 year old female with a history of endometriosis and painful. She presents with lower abdominal pain. She states that every few months she has a painful menstrual cycle. This is what occurred today. She started her menstrual cycle today and had associated lower abdominal severe cramping. She has some associated nausea and vomiting. No fevers. No discharge. She's having her normal vaginal bleeding. She's been seen for similar symptoms multiple times in the past.      Past Medical History:  Diagnosis Date  . Medical history non-contributory     Patient Active Problem List   Diagnosis Date Noted  . UTI (urinary tract infection) 02/20/2016    Past Surgical History:  Procedure Laterality Date  . HERNIA REPAIR      OB History    Gravida Para Term Preterm AB Living   3       3     SAB TAB Ectopic Multiple Live Births   3               Home Medications    Prior to Admission medications   Medication Sig Start Date End Date Taking? Authorizing Provider  ibuprofen (ADVIL,MOTRIN) 800 MG tablet Take 1 tablet (800 mg total) by mouth 3 (three) times daily with meals as needed for mild pain or moderate pain. 05/10/16   Osborne Oman, MD  norgestimate-ethinyl estradiol (SPRINTEC 28) 0.25-35 MG-MCG tablet Take 1 tablet by mouth daily. 06/09/16   Tresea Mall, CNM  traMADol (ULTRAM) 50 MG tablet Take 1 tablet (50 mg total) by mouth every 6 (six) hours as needed for severe pain. 05/10/16   Osborne Oman, MD    Family History History reviewed. No pertinent family history.  Social History Social History  Substance Use Topics  . Smoking status: Former Smoker    Packs/day: 0.30    Types: Cigarettes    Quit date: 11/10/2012  . Smokeless tobacco: Never Used  .  Alcohol use Yes     Comment: occasionally     Allergies   Antihistamines, diphenhydramine-type and Benadryl [diphenhydramine hcl]   Review of Systems Review of Systems  Constitutional: Negative for chills, diaphoresis, fatigue and fever.  HENT: Negative for congestion, rhinorrhea and sneezing.   Eyes: Negative.   Respiratory: Negative for cough, chest tightness and shortness of breath.   Cardiovascular: Negative for chest pain and leg swelling.  Gastrointestinal: Negative for abdominal pain, blood in stool, diarrhea, nausea and vomiting.  Genitourinary: Positive for pelvic pain and vaginal bleeding. Negative for difficulty urinating, flank pain, frequency, hematuria and vaginal discharge.  Musculoskeletal: Negative for arthralgias and back pain.  Skin: Negative for rash.  Neurological: Negative for dizziness, speech difficulty, weakness, numbness and headaches.     Physical Exam Updated Vital Signs BP (!) 159/84   Pulse 85   Resp 18   LMP 10/25/2016 (Approximate)   SpO2 96%   Physical Exam  Constitutional: She is oriented to person, place, and time. She appears well-developed and well-nourished.  HENT:  Head: Normocephalic and atraumatic.  Eyes: Pupils are equal, round, and reactive to light.  Neck: Normal range of motion. Neck supple.  Cardiovascular: Normal rate, regular rhythm and normal heart sounds.   Pulmonary/Chest: Effort normal and breath sounds normal. No respiratory  distress. She has no wheezes. She has no rales. She exhibits no tenderness.  Abdominal: Soft. Bowel sounds are normal. There is tenderness. There is no rebound and no guarding.  Moderate tenderness across the lower abdomen  Musculoskeletal: Normal range of motion. She exhibits no edema.  Lymphadenopathy:    She has no cervical adenopathy.  Neurological: She is alert and oriented to person, place, and time.  Skin: Skin is warm and dry. No rash noted.  Psychiatric: She has a normal mood and affect.       ED Treatments / Results  Labs (all labs ordered are listed, but only abnormal results are displayed) Labs Reviewed  COMPREHENSIVE METABOLIC PANEL - Abnormal; Notable for the following:       Result Value   Potassium 3.2 (*)    CO2 20 (*)    Glucose, Bld 135 (*)    All other components within normal limits  CBC - Abnormal; Notable for the following:    WBC 15.4 (*)    All other components within normal limits  URINALYSIS, ROUTINE W REFLEX MICROSCOPIC - Abnormal; Notable for the following:    APPearance HAZY (*)    Hgb urine dipstick LARGE (*)    Ketones, ur 20 (*)    Protein, ur 30 (*)    Nitrite POSITIVE (*)    Leukocytes, UA TRACE (*)    Bacteria, UA RARE (*)    Squamous Epithelial / LPF 0-5 (*)    All other components within normal limits  LIPASE, BLOOD  I-STAT BETA HCG BLOOD, ED ([facility], [facility], [facility] ONLY)    EKG  EKG Interpretation None       Radiology No results found.  Procedures Procedures (including critical care time)  Medications Ordered in ED Medications  ondansetron (ZOFRAN-ODT) 4 MG disintegrating tablet (not administered)  ondansetron (ZOFRAN-ODT) disintegrating tablet 4 mg (4 mg Oral Given 12/02/16 1751)  ketorolac (TORADOL) injection 60 mg (60 mg Intramuscular Given 12/02/16 1947)  ondansetron (ZOFRAN-ODT) disintegrating tablet 4 mg (4 mg Oral Given 12/02/16 1950)     Initial Impression / Assessment and Plan / ED Course  I have reviewed the triage vital signs and the nursing notes.  Pertinent labs & imaging results that were available during my care of the patient were reviewed by me and considered in my medical decision making (see chart for details).     Patient is a 31 year old with a history of endometriosis and recurrent pelvic pain who presents with a flareup of dysmenorrhea. She improved after her shot of Toradol and Zofran. She states she has to go. She doesn't have any ongoing heavy bleeding. She denies any vaginal discharge. She was  discharged home in good condition. She was encouraged to follow-up with the women's outpatient clinic or another OB/GYN. Return precautions were given.  Final Clinical Impressions(s) / ED Diagnoses   Final diagnoses:  Pelvic pain    New Prescriptions New Prescriptions   No medications on file     Malvin Johns, MD 12/02/16 2135

## 2017-09-27 ENCOUNTER — Encounter (HOSPITAL_COMMUNITY): Payer: Self-pay | Admitting: *Deleted

## 2017-09-27 ENCOUNTER — Other Ambulatory Visit: Payer: Self-pay

## 2017-09-27 ENCOUNTER — Emergency Department (HOSPITAL_COMMUNITY)
Admission: EM | Admit: 2017-09-27 | Discharge: 2017-09-27 | Disposition: A | Discharge status: Home / Self Care | Payer: Self-pay | Attending: Emergency Medicine | Admitting: Emergency Medicine

## 2017-09-27 DIAGNOSIS — N938 Other specified abnormal uterine and vaginal bleeding: Secondary | ICD-10-CM | POA: Insufficient documentation

## 2017-09-27 DIAGNOSIS — R197 Diarrhea, unspecified: Secondary | ICD-10-CM | POA: Insufficient documentation

## 2017-09-27 DIAGNOSIS — Z87891 Personal history of nicotine dependence: Secondary | ICD-10-CM | POA: Insufficient documentation

## 2017-09-27 DIAGNOSIS — R195 Other fecal abnormalities: Secondary | ICD-10-CM | POA: Insufficient documentation

## 2017-09-27 DIAGNOSIS — R112 Nausea with vomiting, unspecified: Secondary | ICD-10-CM | POA: Insufficient documentation

## 2017-09-27 LAB — URINALYSIS, ROUTINE W REFLEX MICROSCOPIC
BILIRUBIN URINE: NEGATIVE
GLUCOSE, UA: NEGATIVE mg/dL
Ketones, ur: NEGATIVE mg/dL
LEUKOCYTES UA: NEGATIVE
NITRITE: NEGATIVE
PH: 6 (ref 5.0–8.0)
Protein, ur: NEGATIVE mg/dL
SPECIFIC GRAVITY, URINE: 1.006 (ref 1.005–1.030)

## 2017-09-27 LAB — WET PREP, GENITAL
SPERM: NONE SEEN
Trich, Wet Prep: NONE SEEN
Yeast Wet Prep HPF POC: NONE SEEN

## 2017-09-27 LAB — COMPREHENSIVE METABOLIC PANEL
ALT: 18 U/L (ref 14–54)
AST: 22 U/L (ref 15–41)
Albumin: 4 g/dL (ref 3.5–5.0)
Alkaline Phosphatase: 56 U/L (ref 38–126)
Anion gap: 8 (ref 5–15)
BILIRUBIN TOTAL: 0.5 mg/dL (ref 0.3–1.2)
BUN: 13 mg/dL (ref 6–20)
CALCIUM: 9.2 mg/dL (ref 8.9–10.3)
CO2: 27 mmol/L (ref 22–32)
Chloride: 104 mmol/L (ref 101–111)
Creatinine, Ser: 0.88 mg/dL (ref 0.44–1.00)
GFR calc Af Amer: >60 mL/min (ref >60)
Glucose, Bld: 90 mg/dL (ref 65–99)
Potassium: 3.6 mmol/L (ref 3.5–5.1)
Sodium: 139 mmol/L (ref 135–145)
TOTAL PROTEIN: 7.2 g/dL (ref 6.5–8.1)

## 2017-09-27 LAB — LIPASE, BLOOD: Lipase: 30 U/L (ref 11–51)

## 2017-09-27 LAB — CBC
HEMATOCRIT: 38.6 % (ref 36.0–46.0)
Hemoglobin: 12.8 g/dL (ref 12.0–15.0)
MCH: 32.1 pg (ref 26.0–34.0)
MCHC: 33.2 g/dL (ref 30.0–36.0)
MCV: 96.7 fL (ref 78.0–100.0)
PLATELETS: 289 10*3/uL (ref 150–400)
RBC: 3.99 MIL/uL (ref 3.87–5.11)
RDW: 13.6 % (ref 11.5–15.5)
WBC: 12.1 10*3/uL — AB (ref 4.0–10.5)

## 2017-09-27 LAB — POC OCCULT BLOOD, ED: Fecal Occult Bld: POSITIVE — AB

## 2017-09-27 LAB — I-STAT BETA HCG BLOOD, ED (MC, WL, AP ONLY): I-stat hCG, quantitative: <5 m[IU]/mL (ref <5)

## 2017-09-27 MED ORDER — ONDANSETRON HCL 4 MG PO TABS: 4.0000 mg | ORAL_TABLET | Freq: Three times a day (TID) | ORAL | 0 refills | Status: DC | PRN

## 2017-09-27 NOTE — Discharge Instructions (Signed)
Push fluids: take small frequent sips of water or Gatorade, do not drink any soda, juice or caffeinated beverages.    Slowly resume solid diet as desired. Avoid food that are spicy, contain dairy and/or have high fat content.  Aviod NSAIDs (aspirin, motrin, ibuprofen, naproxen, Aleve et Ronney Asters) for pain control because they will irritate your stomach.  Do not hesitate to return to the emergency room for any new, worsening or concerning symptoms.  Please obtain primary care using resource guide below. Let them know that you were seen in the emergency room and that they will need to obtain records for further outpatient management.

## 2017-09-27 NOTE — ED Notes (Signed)
Pt given water, tolerating well 

## 2017-09-27 NOTE — ED Triage Notes (Signed)
The pt  Is c/o abd pain for one month and she has had her period for one molnth.  Her vaginal bleeding stopped yesterday since then her bleeding has been worse

## 2017-09-27 NOTE — ED Provider Notes (Signed)
[facility] Provider Note   CSN: [id number] Arrival date & time: 09/27/17  0010     History   Chief Complaint Chief Complaint  Patient presents with  . Abdominal Pain    HPI   Blood pressure 135/85, pulse 92, temperature 99.6 F (37.6 C), temperature source Oral, resp. rate 18, height 4\' 9"  (1.448 m), weight 45.4 kg (100 lb), last menstrual period 09/27/2017, SpO2 92 %.  Sonya Nguyen is a 32 y.o. female with past medical history significant for endometriosis stating that she is having her menses over the last month passing softball size clots regularly, she stopped bleeding yesterday but since that time she states that she has had bright red blood per rectum an additional 2 hematemesis.  Not anticoagulated, not a heavy drinker, no excessive NSAID use.  Denies fevers, chills, syncope, palpitations.  She endorses diffuse abdominal cramping.  She also is reporting greenish diarrhea.  Past Medical History:  Diagnosis Date  . Medical history non-contributory     Patient Active Problem List   Diagnosis Date Noted  . UTI (urinary tract infection) 02/20/2016    Past Surgical History:  Procedure Laterality Date  . HERNIA REPAIR       OB History    Gravida  3   Para      Term      Preterm      AB  3   Living        SAB  3   TAB      Ectopic      Multiple      Live Births               Home Medications    Prior to Admission medications   Medication Sig Start Date End Date Taking? Authorizing Provider  ibuprofen (ADVIL,MOTRIN) 800 MG tablet Take 1 tablet (800 mg total) by mouth 3 (three) times daily with meals as needed for mild pain or moderate pain. Patient not taking: Reported on 09/27/2017 05/10/16   Sonya Oman, MD  norgestimate-ethinyl estradiol (SPRINTEC 28) 0.25-35 MG-MCG tablet Take 1 tablet by mouth daily. Patient not taking: Reported on 09/27/2017 06/09/16   Sonya Nguyen, CNM  ondansetron  (ZOFRAN) 4 MG tablet Take 1 tablet (4 mg total) by mouth every 8 (eight) hours as needed for nausea or vomiting. 09/27/17   Sonya Nguyen, Sonya Ricks, PA-C  traMADol (ULTRAM) 50 MG tablet Take 1 tablet (50 mg total) by mouth every 6 (six) hours as needed for severe pain. Patient not taking: Reported on 09/27/2017 05/10/16   Sonya Oman, MD    Family History No family history on file.  Social History Social History   Tobacco Use  . Smoking status: Former Smoker    Packs/day: 0.30    Types: Cigarettes    Last attempt to quit: 11/10/2012    Years since quitting: 4.8  . Smokeless tobacco: Never Used  Substance Use Topics  . Alcohol use: Yes    Comment: occasionally  . Drug use: No     Allergies   Antihistamines, diphenhydramine-type and Benadryl [diphenhydramine hcl]   Review of Systems Review of Systems  A complete review of systems was obtained and all systems are negative except as noted in the HPI and PMH.   Physical Exam Updated Vital Signs BP (!) 126/91 (BP Location: Right Arm)   Pulse 82   Temp 98.5 F (36.9 C) (Oral)   Resp 18   Ht 4\' 9"  (  1.448 m)   Wt 45.4 kg (100 lb)   LMP 09/27/2017   SpO2 100%   BMI 21.64 kg/m   Physical Exam  Constitutional: She is oriented to person, place, and time. She appears well-developed and well-nourished. No distress.  HENT:  Head: Normocephalic and atraumatic.  Mouth/Throat: Oropharynx is clear and moist.  Eyes: Pupils are equal, round, and reactive to light. Conjunctivae and EOM are normal.  Neck: Normal range of motion.  Cardiovascular: Normal rate, regular rhythm and intact distal pulses.  Pulmonary/Chest: Effort normal and breath sounds normal.  Abdominal: Soft. There is no tenderness.  Genitourinary:  Genitourinary Comments: Pelvic exam is chaperoned by nurse: No rashes or lesions, no abnormal vaginal discharge, no cervical motion or adnexal tenderness, no blood in the posterior fornix or at the cervix.  Rectal exam with  greenish stool, blood streaking, no significant tenderness, no hemorrhoids or fissures.  Musculoskeletal: Normal range of motion.  Neurological: She is alert and oriented to person, place, and time.  Skin: She is not diaphoretic.  Psychiatric: She has a normal mood and affect.  Nursing note and vitals reviewed.    ED Treatments / Results  Labs (all labs ordered are listed, but only abnormal results are displayed) Labs Reviewed  WET PREP, GENITAL - Abnormal; Notable for the following components:      Result Value   Clue Cells Wet Prep HPF POC PRESENT (*)    WBC, Wet Prep HPF POC FEW (*)    All other components within normal limits  CBC - Abnormal; Notable for the following components:   WBC 12.1 (*)    All other components within normal limits  URINALYSIS, ROUTINE W REFLEX MICROSCOPIC - Abnormal; Notable for the following components:   Color, Urine STRAW (*)    Hgb urine dipstick SMALL (*)    Bacteria, UA RARE (*)    Squamous Epithelial / LPF 0-5 (*)    All other components within normal limits  POC OCCULT BLOOD, ED - Abnormal; Notable for the following components:   Fecal Occult Bld POSITIVE (*)    All other components within normal limits  LIPASE, BLOOD  COMPREHENSIVE METABOLIC PANEL  RPR  HIV ANTIBODY (ROUTINE TESTING)  OCCULT BLOOD GASTRIC / DUODENUM (SPECIMEN CUP)  I-STAT BETA HCG BLOOD, ED ([facility], [facility], [facility] ONLY)  GC/CHLAMYDIA PROBE AMP ([place]) NOT AT [facility]    EKG None  Radiology No results found.  Procedures Procedures (including critical care time)  Medications Ordered in ED Medications - No data to display   Initial Impression / Assessment and Plan / ED Course  I have reviewed the triage vital signs and the nursing notes.  Pertinent labs & imaging results that were available during my care of the patient were reviewed by me and considered in my medical decision making (see chart for details).     Vitals:   09/27/17 0032 09/27/17 0405 09/27/17 0945  09/27/17 1018  BP:  (!) 126/93 135/85 (!) 126/91  Pulse:  76 92 82  Resp:  16 18   Temp:  99.6 F (37.6 C)  98.5 F (36.9 C)  TempSrc:  Oral  Oral  SpO2:  96% 92% 100%  Weight: 45.4 kg (100 lb)     Height: 4\' 9"  (1.448 m)       Sonya Nguyen is 32 y.o. female presenting with abdominal cramping, she states that she has been menstruating for 1 month.  Abdominal exam is benign, no anemia seen on CBC.  She  states that she has had nausea vomiting and diarrhea, these appear to be blood streaked.  Patient with stable vital signs, digital rectal exam reveals a greenish diarrhea with blood streaking, guaiac is positive I think this is likely secondary to irritation from the diarrhea, I doubt acute lower GI bleed requiring admission or intervention.  Likely a viral gastroenteritis.  Vital signs remained stable in the ED.  Patient will follow with OB/GYN for her dysfunctional uterine bleeding, we have had an extensive discussion of return precautions for anemia and GI bleed, patient verbalized understanding and teach back technique.  Evaluation does not show pathology that would require ongoing emergent intervention or inpatient treatment. Pt is hemodynamically stable and mentating appropriately. Discussed findings and plan with patient/guardian, who agrees with care plan. All questions answered. Return precautions discussed and outpatient follow up given.      Final Clinical Impressions(s) / ED Diagnoses   Final diagnoses:  Nausea vomiting and diarrhea  Positive occult stool blood test  DUB (dysfunctional uterine bleeding)    ED Discharge Orders        Ordered    ondansetron (ZOFRAN) 4 MG tablet  Every 8 hours PRN     09/27/17 1201       Sheyna Pettibone, Charna Elizabeth 09/27/17 1447    Malvin Johns, MD 09/27/17 1510

## 2017-09-28 LAB — GC/CHLAMYDIA PROBE AMP (~~LOC~~) NOT AT ARMC
Chlamydia: NEGATIVE
Neisseria Gonorrhea: NEGATIVE

## 2017-09-28 LAB — HIV ANTIBODY (ROUTINE TESTING W REFLEX): HIV Screen 4th Generation wRfx: NONREACTIVE

## 2017-09-28 LAB — RPR: RPR: NONREACTIVE

## 2017-12-22 ENCOUNTER — Emergency Department (HOSPITAL_COMMUNITY): Payer: Self-pay

## 2017-12-22 ENCOUNTER — Emergency Department (HOSPITAL_COMMUNITY)
Admission: EM | Admit: 2017-12-22 | Discharge: 2017-12-22 | Disposition: A | Discharge status: Home / Self Care | Payer: Self-pay | Attending: Emergency Medicine | Admitting: Emergency Medicine

## 2017-12-22 ENCOUNTER — Encounter (HOSPITAL_COMMUNITY): Payer: Self-pay | Admitting: Emergency Medicine

## 2017-12-22 DIAGNOSIS — Y92002 Bathroom of unspecified non-institutional (private) residence single-family (private) house as the place of occurrence of the external cause: Secondary | ICD-10-CM | POA: Insufficient documentation

## 2017-12-22 DIAGNOSIS — Z23 Encounter for immunization: Secondary | ICD-10-CM | POA: Insufficient documentation

## 2017-12-22 DIAGNOSIS — Y939 Activity, unspecified: Secondary | ICD-10-CM | POA: Insufficient documentation

## 2017-12-22 DIAGNOSIS — N939 Abnormal uterine and vaginal bleeding, unspecified: Secondary | ICD-10-CM

## 2017-12-22 DIAGNOSIS — N938 Other specified abnormal uterine and vaginal bleeding: Secondary | ICD-10-CM | POA: Insufficient documentation

## 2017-12-22 DIAGNOSIS — R319 Hematuria, unspecified: Secondary | ICD-10-CM | POA: Insufficient documentation

## 2017-12-22 DIAGNOSIS — Z87891 Personal history of nicotine dependence: Secondary | ICD-10-CM | POA: Insufficient documentation

## 2017-12-22 DIAGNOSIS — Y999 Unspecified external cause status: Secondary | ICD-10-CM | POA: Insufficient documentation

## 2017-12-22 DIAGNOSIS — R0789 Other chest pain: Secondary | ICD-10-CM | POA: Insufficient documentation

## 2017-12-22 DIAGNOSIS — M79651 Pain in right thigh: Secondary | ICD-10-CM | POA: Insufficient documentation

## 2017-12-22 DIAGNOSIS — S0990XA Unspecified injury of head, initial encounter: Secondary | ICD-10-CM | POA: Insufficient documentation

## 2017-12-22 LAB — CBC WITH DIFFERENTIAL/PLATELET
Abs Immature Granulocytes: 0 10*3/uL (ref 0.0–0.1)
Basophils Absolute: 0.1 10*3/uL (ref 0.0–0.1)
Basophils Relative: 1 %
Eosinophils Absolute: 0.1 10*3/uL (ref 0.0–0.7)
Eosinophils Relative: 1 %
HCT: 38.2 % (ref 36.0–46.0)
Hemoglobin: 12.4 g/dL (ref 12.0–15.0)
Immature Granulocytes: 0 %
Lymphocytes Relative: 23 %
Lymphs Abs: 2.9 10*3/uL (ref 0.7–4.0)
MCH: 31.2 pg (ref 26.0–34.0)
MCHC: 32.5 g/dL (ref 30.0–36.0)
MCV: 96 fL (ref 78.0–100.0)
Monocytes Absolute: 1.1 10*3/uL — ABNORMAL HIGH (ref 0.1–1.0)
Monocytes Relative: 9 %
Neutro Abs: 8.3 10*3/uL — ABNORMAL HIGH (ref 1.7–7.7)
Neutrophils Relative %: 66 %
Platelets: 282 10*3/uL (ref 150–400)
RBC: 3.98 MIL/uL (ref 3.87–5.11)
RDW: 15.1 % (ref 11.5–15.5)
WBC: 12.5 10*3/uL — ABNORMAL HIGH (ref 4.0–10.5)

## 2017-12-22 LAB — URINALYSIS, ROUTINE W REFLEX MICROSCOPIC
Bilirubin Urine: NEGATIVE
Glucose, UA: NEGATIVE mg/dL
Ketones, ur: NEGATIVE mg/dL
Leukocytes, UA: NEGATIVE
Nitrite: POSITIVE — AB
Protein, ur: NEGATIVE mg/dL
Specific Gravity, Urine: 1.012 (ref 1.005–1.030)
pH: 5 (ref 5.0–8.0)

## 2017-12-22 LAB — BASIC METABOLIC PANEL
Anion gap: 10 (ref 5–15)
BUN: 9 mg/dL (ref 6–20)
CO2: 26 mmol/L (ref 22–32)
Calcium: 9.4 mg/dL (ref 8.9–10.3)
Chloride: 107 mmol/L (ref 98–111)
Creatinine, Ser: 0.75 mg/dL (ref 0.44–1.00)
GFR calc Af Amer: >60 mL/min (ref >60)
GFR calc non Af Amer: >60 mL/min (ref >60)
Glucose, Bld: 92 mg/dL (ref 70–99)
Potassium: 3.8 mmol/L (ref 3.5–5.1)
Sodium: 143 mmol/L (ref 135–145)

## 2017-12-22 LAB — POC URINE PREG, ED: Preg Test, Ur: NEGATIVE

## 2017-12-22 MED ORDER — TETANUS-DIPHTH-ACELL PERTUSSIS 5-2.5-18.5 LF-MCG/0.5 IM SUSP
0.5000 mL | Freq: Once | INTRAMUSCULAR | Status: AC
  Administered 2017-12-22: 0.5 mL via INTRAMUSCULAR
  Filled 2017-12-22: qty 0.5

## 2017-12-22 MED ORDER — IOHEXOL 300 MG/ML  SOLN
100.0000 mL | Freq: Once | INTRAMUSCULAR | Status: AC | PRN
  Administered 2017-12-22: 100 mL via INTRAVENOUS

## 2017-12-22 NOTE — Discharge Instructions (Signed)
Alternate 600 mg of ibuprofen and [phone] mg of Tylenol every 3 hours as needed for pain. Do not exceed 4000 mg of Tylenol daily.  Take ibuprofen with food to avoid upset stomach issues.  Alternate ice or heat 20 minutes on 20 minutes off, or which ever feels best.  Take hot showers and hot baths, do some gentle stretching to avoid muscle stiffness.  Follow-up with OB/GYN for reevaluation of your endometriosis and vaginal bleeding.  You may follow-up with Dr. Hulan Saas at [facility] sports medicine for reevaluation of possible concussion.  In the meantime, stay in a dimly lit room with minimal stimuli.  Try to cut screen time in half or more.  Avoid contact sports.  Return to the emergency department if any concerning signs or symptoms develop such as severe headache, vision changes, fevers, loss of consciousness, or difficulty walking.
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
## 2017-12-22 NOTE — ED Notes (Signed)
Patient transported to CT 

## 2017-12-22 NOTE — ED Notes (Signed)
Patient ambulatory to bathroom with steady gait at this time 
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
## 2018-01-12 ENCOUNTER — Emergency Department (HOSPITAL_COMMUNITY)
Admission: EM | Admit: 2018-01-12 | Discharge: 2018-01-12 | Disposition: A | Discharge status: Home / Self Care | Payer: Self-pay | Attending: Emergency Medicine | Admitting: Emergency Medicine

## 2018-01-12 ENCOUNTER — Encounter (HOSPITAL_COMMUNITY): Payer: Self-pay | Admitting: Emergency Medicine

## 2018-01-12 DIAGNOSIS — R102 Pelvic and perineal pain: Secondary | ICD-10-CM | POA: Insufficient documentation

## 2018-01-12 DIAGNOSIS — R42 Dizziness and giddiness: Secondary | ICD-10-CM | POA: Insufficient documentation

## 2018-01-12 DIAGNOSIS — R112 Nausea with vomiting, unspecified: Secondary | ICD-10-CM | POA: Insufficient documentation

## 2018-01-12 DIAGNOSIS — Z87891 Personal history of nicotine dependence: Secondary | ICD-10-CM | POA: Insufficient documentation

## 2018-01-12 DIAGNOSIS — N939 Abnormal uterine and vaginal bleeding, unspecified: Secondary | ICD-10-CM | POA: Insufficient documentation

## 2018-01-12 LAB — WET PREP, GENITAL
CLUE CELLS WET PREP: NONE SEEN
Sperm: NONE SEEN
Trich, Wet Prep: NONE SEEN
YEAST WET PREP: NONE SEEN

## 2018-01-12 LAB — CBC
HCT: 38.6 % (ref 36.0–46.0)
Hemoglobin: 12.5 g/dL (ref 12.0–15.0)
MCH: 30.8 pg (ref 26.0–34.0)
MCHC: 32.4 g/dL (ref 30.0–36.0)
MCV: 95.1 fL (ref 78.0–100.0)
Platelets: 272 10*3/uL (ref 150–400)
RBC: 4.06 MIL/uL (ref 3.87–5.11)
RDW: 15.1 % (ref 11.5–15.5)
WBC: 7.6 10*3/uL (ref 4.0–10.5)

## 2018-01-12 LAB — COMPREHENSIVE METABOLIC PANEL
ALT: 20 U/L (ref 0–44)
AST: 45 U/L — ABNORMAL HIGH (ref 15–41)
Albumin: 4.2 g/dL (ref 3.5–5.0)
Alkaline Phosphatase: 59 U/L (ref 38–126)
Anion gap: 9 (ref 5–15)
BUN: 13 mg/dL (ref 6–20)
CO2: 25 mmol/L (ref 22–32)
Calcium: 9 mg/dL (ref 8.9–10.3)
Chloride: 106 mmol/L (ref 98–111)
Creatinine, Ser: 0.89 mg/dL (ref 0.44–1.00)
GFR calc Af Amer: >60 mL/min (ref >60)
GFR calc non Af Amer: >60 mL/min (ref >60)
Glucose, Bld: 99 mg/dL (ref 70–99)
Potassium: 3.6 mmol/L (ref 3.5–5.1)
Sodium: 140 mmol/L (ref 135–145)
Total Bilirubin: 0.4 mg/dL (ref 0.3–1.2)
Total Protein: 7.4 g/dL (ref 6.5–8.1)

## 2018-01-12 LAB — I-STAT BETA HCG BLOOD, ED (MC, WL, AP ONLY): I-stat hCG, quantitative: <5 m[IU]/mL (ref <5)

## 2018-01-12 LAB — LIPASE, BLOOD: Lipase: 31 U/L (ref 11–51)

## 2018-01-12 MED ORDER — KETOROLAC TROMETHAMINE 30 MG/ML IJ SOLN: 30.0000 mg | Freq: Once | INTRAMUSCULAR | Status: DC

## 2018-01-12 MED ORDER — KETOROLAC TROMETHAMINE 30 MG/ML IJ SOLN
30.0000 mg | Freq: Once | INTRAMUSCULAR | Status: AC
  Administered 2018-01-12: 30 mg via INTRAMUSCULAR
  Filled 2018-01-12: qty 1

## 2018-01-12 MED ORDER — NAPROXEN 500 MG PO TABS: 500.0000 mg | ORAL_TABLET | Freq: Two times a day (BID) | ORAL | 0 refills | Status: DC

## 2018-01-12 NOTE — ED Notes (Signed)
Declined W/C at D/C and was escorted to lobby by RN. 

## 2018-01-12 NOTE — Discharge Instructions (Signed)
Please read and follow all provided instructions.  Your diagnoses today include:  1. Pelvic pain   2. Vaginal bleeding     Tests performed today include:  Blood counts and electrolytes  Blood tests to check liver and kidney function  Urine test to look for infection and pregnancy (in women)  Wet prep - no vaginal infection seen  Vital signs. See below for your results today.   Medications prescribed:   Naproxen - anti-inflammatory pain medication  Do not exceed 500mg  naproxen every 12 hours, take with food  You have been prescribed an anti-inflammatory medication or NSAID. Take with food. Take smallest effective dose for the shortest duration needed for your pain. Stop taking if you experience stomach pain or vomiting.   Take any prescribed medications only as directed.  Home care instructions:   Follow any educational materials contained in this packet.  Follow-up instructions: Please follow-up with your gynecologist when able for further evaluation of your symptoms.  Return instructions:  SEEK IMMEDIATE MEDICAL ATTENTION IF:  The pain does not go away or becomes severe   A temperature above 101F develops   Repeated vomiting occurs (multiple episodes)   The pain becomes localized to portions of the abdomen. The right side could possibly be appendicitis. In an adult, the left lower portion of the abdomen could be colitis or diverticulitis.   Blood is being passed in stools or vomit (bright red or black tarry stools)   You develop chest pain, difficulty breathing, dizziness or fainting, or become confused, poorly responsive, or inconsolable (young children)  If you have any other emergent concerns regarding your health  Additional Information: Abdominal (belly) pain can be caused by many things. Your caregiver performed an examination and possibly ordered blood/urine tests and imaging (CT scan, x-rays, ultrasound). Many cases can be observed and treated at home  after initial evaluation in the emergency department. Even though you are being discharged home, abdominal pain can be unpredictable. Therefore, you need a repeated exam if your pain does not resolve, returns, or worsens. Most patients with abdominal pain don't have to be admitted to the hospital or have surgery, but serious problems like appendicitis and gallbladder attacks can start out as nonspecific pain. Many abdominal conditions cannot be diagnosed in one visit, so follow-up evaluations are very important.  Your vital signs today were: BP (!) 140/110 (BP Location: Right Arm)    Pulse 63    Temp 99.5 F (37.5 C) (Oral)    Resp 18    LMP 01/12/2018    SpO2 99%  If your blood pressure (bp) was elevated above 135/85 this visit, please have this repeated by your doctor within one month. --------------
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
## 2018-01-12 NOTE — ED Triage Notes (Signed)
Pt to ER for evaluation of vaginal bleeding x3 weeks, hx of endometriosis. Reports also nausea, vomiting, and diarrhea x2 days.

## 2018-01-14 LAB — GC/CHLAMYDIA PROBE AMP (~~LOC~~) NOT AT ARMC
CHLAMYDIA, DNA PROBE: NEGATIVE
Neisseria Gonorrhea: NEGATIVE

## 2019-12-05 ENCOUNTER — Other Ambulatory Visit: Payer: Self-pay

## 2019-12-05 ENCOUNTER — Emergency Department (HOSPITAL_COMMUNITY)
Admission: EM | Admit: 2019-12-05 | Discharge: 2019-12-05 | Disposition: A | Discharge status: Home / Self Care | Payer: Self-pay | Attending: Emergency Medicine | Admitting: Emergency Medicine

## 2019-12-05 ENCOUNTER — Encounter (HOSPITAL_COMMUNITY): Payer: Self-pay | Admitting: Emergency Medicine

## 2019-12-05 ENCOUNTER — Emergency Department (HOSPITAL_COMMUNITY): Payer: Self-pay

## 2019-12-05 DIAGNOSIS — Z791 Long term (current) use of non-steroidal anti-inflammatories (NSAID): Secondary | ICD-10-CM | POA: Insufficient documentation

## 2019-12-05 DIAGNOSIS — Z87891 Personal history of nicotine dependence: Secondary | ICD-10-CM | POA: Insufficient documentation

## 2019-12-05 DIAGNOSIS — M25551 Pain in right hip: Secondary | ICD-10-CM | POA: Insufficient documentation

## 2019-12-05 MED ORDER — METHOCARBAMOL 500 MG PO TABS: 500.0000 mg | ORAL_TABLET | Freq: Two times a day (BID) | ORAL | 0 refills | Status: DC
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
## 2019-12-05 NOTE — Progress Notes (Signed)
Orthopedic Tech Progress Note Patient Details:  Sonya Nguyen 1985/07/27 [id number]  Ortho Devices Type of Ortho Device: Crutches Ortho Device/Splint Interventions: Ordered, Adjustment   Post Interventions Patient Tolerated: Well, Ambulated well Instructions Provided: Poper ambulation with device, Care of device   Janit Pagan 12/05/2019, 2:13 PM

## 2019-12-05 NOTE — Discharge Instructions (Addendum)
Your x-ray without any fracture today.  There is some slight thickening of the bone however no evidence of a break. Please follow-up with [facility] as you may benefit from further work-up and therapy from an orthopedic doctor.    Please use Tylenol or ibuprofen for pain.  You may use 600 mg ibuprofen every 6 hours or 1000 mg of Tylenol every 6 hours.  You may choose to alternate between the 2.  This would be most effective.  Not to exceed 4 g of Tylenol within 24 hours.  Not to exceed 3200 mg ibuprofen 24 hours. You may also use Robaxin as needed for pain.

## 2020-03-14 ENCOUNTER — Emergency Department (HOSPITAL_COMMUNITY): Payer: Self-pay

## 2020-03-14 ENCOUNTER — Encounter (HOSPITAL_COMMUNITY): Payer: Self-pay | Admitting: Emergency Medicine

## 2020-03-14 ENCOUNTER — Other Ambulatory Visit: Payer: Self-pay

## 2020-03-14 ENCOUNTER — Emergency Department (HOSPITAL_COMMUNITY)
Admission: EM | Admit: 2020-03-14 | Discharge: 2020-03-14 | Disposition: A | Discharge status: Home / Self Care | Payer: Self-pay | Attending: Emergency Medicine | Admitting: Emergency Medicine

## 2020-03-14 DIAGNOSIS — Z87891 Personal history of nicotine dependence: Secondary | ICD-10-CM | POA: Insufficient documentation

## 2020-03-14 DIAGNOSIS — T07XXXA Unspecified multiple injuries, initial encounter: Secondary | ICD-10-CM

## 2020-03-14 DIAGNOSIS — S21212A Laceration without foreign body of left back wall of thorax without penetration into thoracic cavity, initial encounter: Secondary | ICD-10-CM | POA: Insufficient documentation

## 2020-03-14 DIAGNOSIS — S51812A Laceration without foreign body of left forearm, initial encounter: Secondary | ICD-10-CM | POA: Insufficient documentation

## 2020-03-14 DIAGNOSIS — Z23 Encounter for immunization: Secondary | ICD-10-CM | POA: Insufficient documentation

## 2020-03-14 LAB — POC URINE PREG, ED: Preg Test, Ur: NEGATIVE

## 2020-03-14 MED ORDER — DOXYCYCLINE HYCLATE 100 MG PO CAPS: 100.0000 mg | ORAL_CAPSULE | Freq: Two times a day (BID) | ORAL | 0 refills | Status: AC

## 2020-03-14 MED ORDER — TETANUS-DIPHTH-ACELL PERTUSSIS 5-2.5-18.5 LF-MCG/0.5 IM SUSP
0.5000 mL | Freq: Once | INTRAMUSCULAR | Status: AC
  Administered 2020-03-14: 0.5 mL via INTRAMUSCULAR
  Filled 2020-03-14: qty 0.5

## 2020-03-14 MED ORDER — LIDOCAINE-EPINEPHRINE 1 %-1:100000 IJ SOLN
20.0000 mL | Freq: Once | INTRAMUSCULAR | Status: AC
  Administered 2020-03-14: 20 mL
  Filled 2020-03-14: qty 1
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
## 2021-03-01 ENCOUNTER — Emergency Department (HOSPITAL_COMMUNITY): Payer: BC Managed Care – PPO

## 2021-03-01 ENCOUNTER — Emergency Department (HOSPITAL_COMMUNITY)
Admission: EM | Admit: 2021-03-01 | Discharge: 2021-03-01 | Disposition: A | Discharge status: Home / Self Care | Payer: BC Managed Care – PPO | Attending: Emergency Medicine | Admitting: Emergency Medicine

## 2021-03-01 ENCOUNTER — Other Ambulatory Visit: Payer: Self-pay

## 2021-03-01 DIAGNOSIS — U071 COVID-19: Secondary | ICD-10-CM | POA: Insufficient documentation

## 2021-03-01 DIAGNOSIS — Z5321 Procedure and treatment not carried out due to patient leaving prior to being seen by health care provider: Secondary | ICD-10-CM | POA: Insufficient documentation

## 2021-03-01 DIAGNOSIS — R519 Headache, unspecified: Secondary | ICD-10-CM | POA: Diagnosis present

## 2021-03-01 LAB — RESP PANEL BY RT-PCR (FLU A&B, COVID) ARPGX2
Influenza A by PCR: NEGATIVE
Influenza B by PCR: NEGATIVE
SARS Coronavirus 2 by RT PCR: POSITIVE — AB

## 2021-03-01 NOTE — ED Notes (Signed)
Pt called for vitals, no response. 

## 2021-03-01 NOTE — ED Triage Notes (Signed)
Pt arrived c/o headache and chest pain when she takes a deep breath that started yesterday  Headache is on back of head and both temples, denies N/V   Denies trauma to head

## 2021-03-01 NOTE — ED Provider Notes (Signed)
Emergency Medicine Provider Triage Evaluation Note  Unnamed Hino , a 35 y.o. female  was evaluated in triage.  Pt complains of headache, fever, body aches, sob since yesterday. Has tried tylenol without relief. No documented fever. No sick contacts. Is not vaccinated for COVID-19.   Review of Systems  Positive: Headache, chills, SOB, body aches, cough, congestion Negative: N/V, diarrhea, constipation  Physical Exam  BP (!) 141/98 (BP Location: Left Arm)   Pulse 99   Temp (!) 101.5 F (38.6 C) (Oral)   Resp 16   SpO2 98%  Gen:   Awake, no distress   Resp:  Normal effort  MSK:   Moves extremities without difficulty  Other:    Medical Decision Making  Medically screening exam initiated at 12:18 PM.  Appropriate orders placed.  Shamiah Kahler was informed that the remainder of the evaluation will be completed by another provider, this initial triage assessment does not replace that evaluation, and the importance of remaining in the ED until their evaluation is complete.     Estill Cotta 03/01/21 1222    Wyvonnia Dusky, MD 03/01/21 1329

## 2021-03-01 NOTE — ED Notes (Signed)
Lab called + COVID

## 2021-03-01 NOTE — ED Notes (Signed)
Unable to update VS. Pt did not respond when name was called.

## 2022-02-11 IMAGING — DX DG CHEST 2V
2 series · 2 of 2 positions shown · non-contrast
Comparison: 03/14/2020

CLINICAL DATA: Shortness of breath.

EXAM:
CHEST - 2 VIEW

[chest lat]
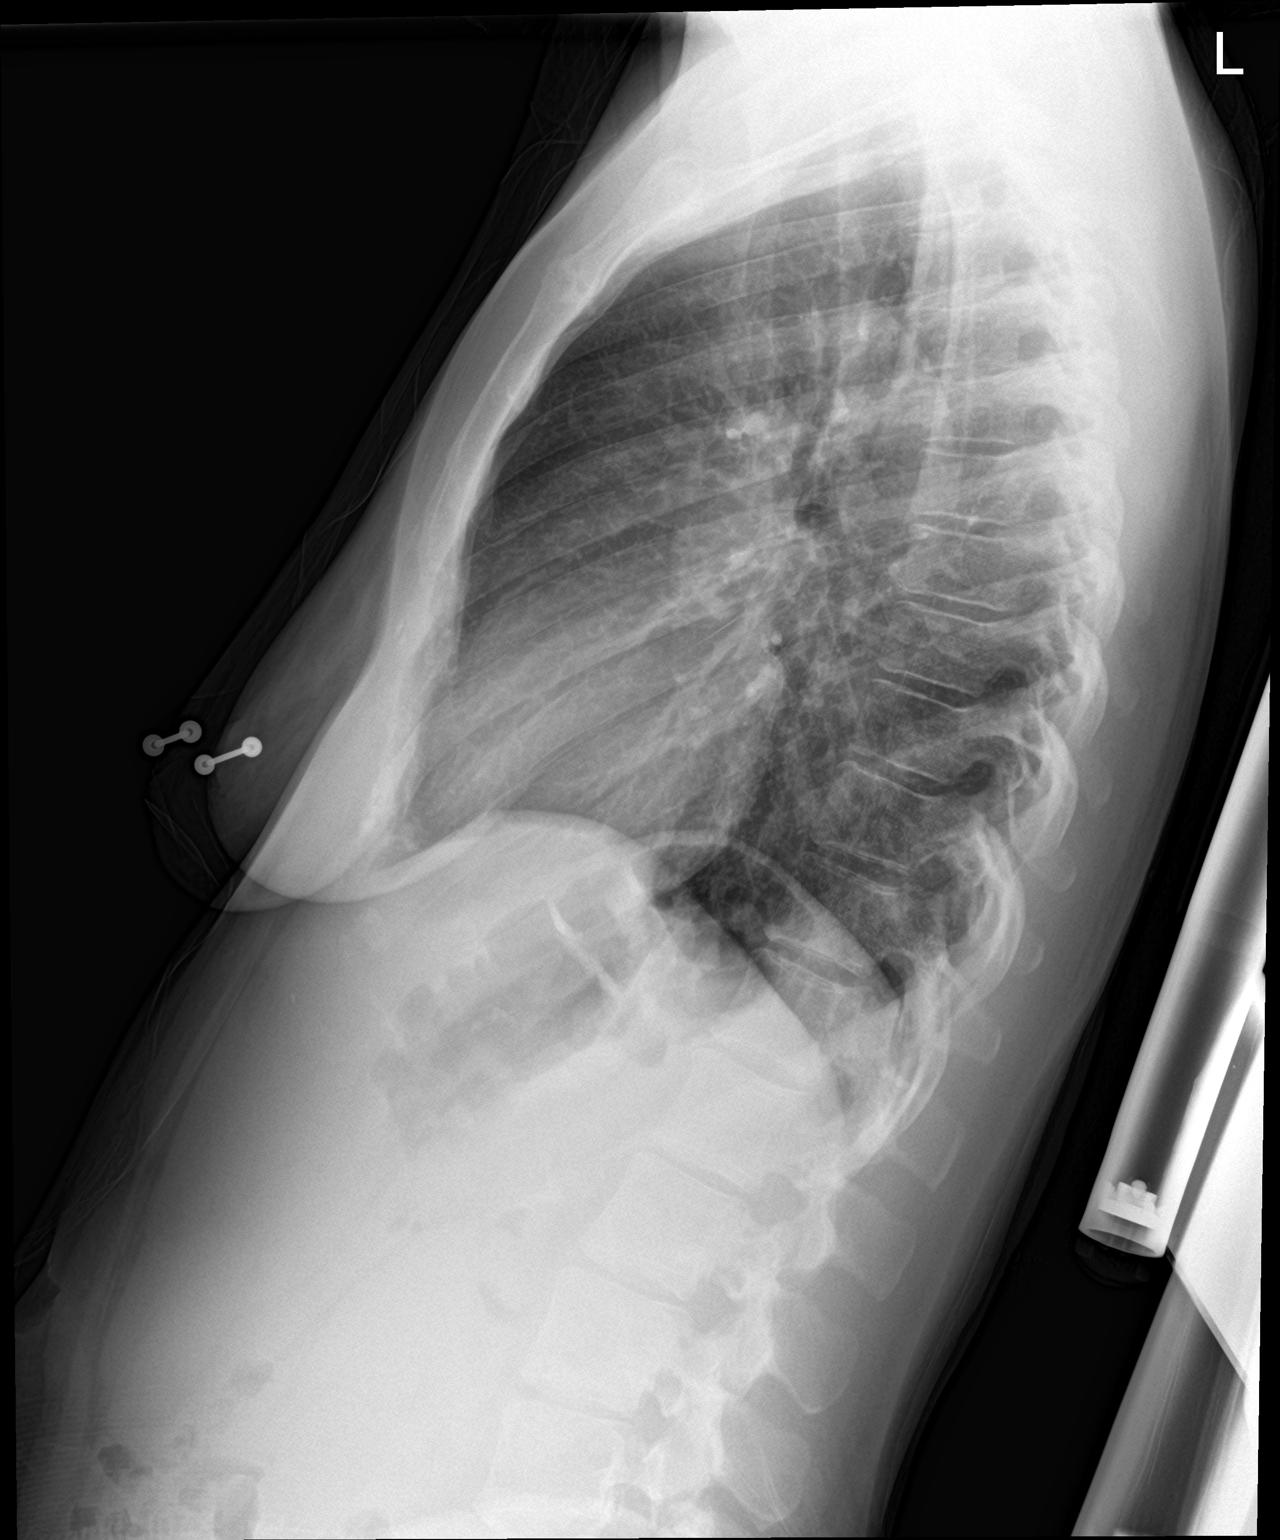

[chest ap]
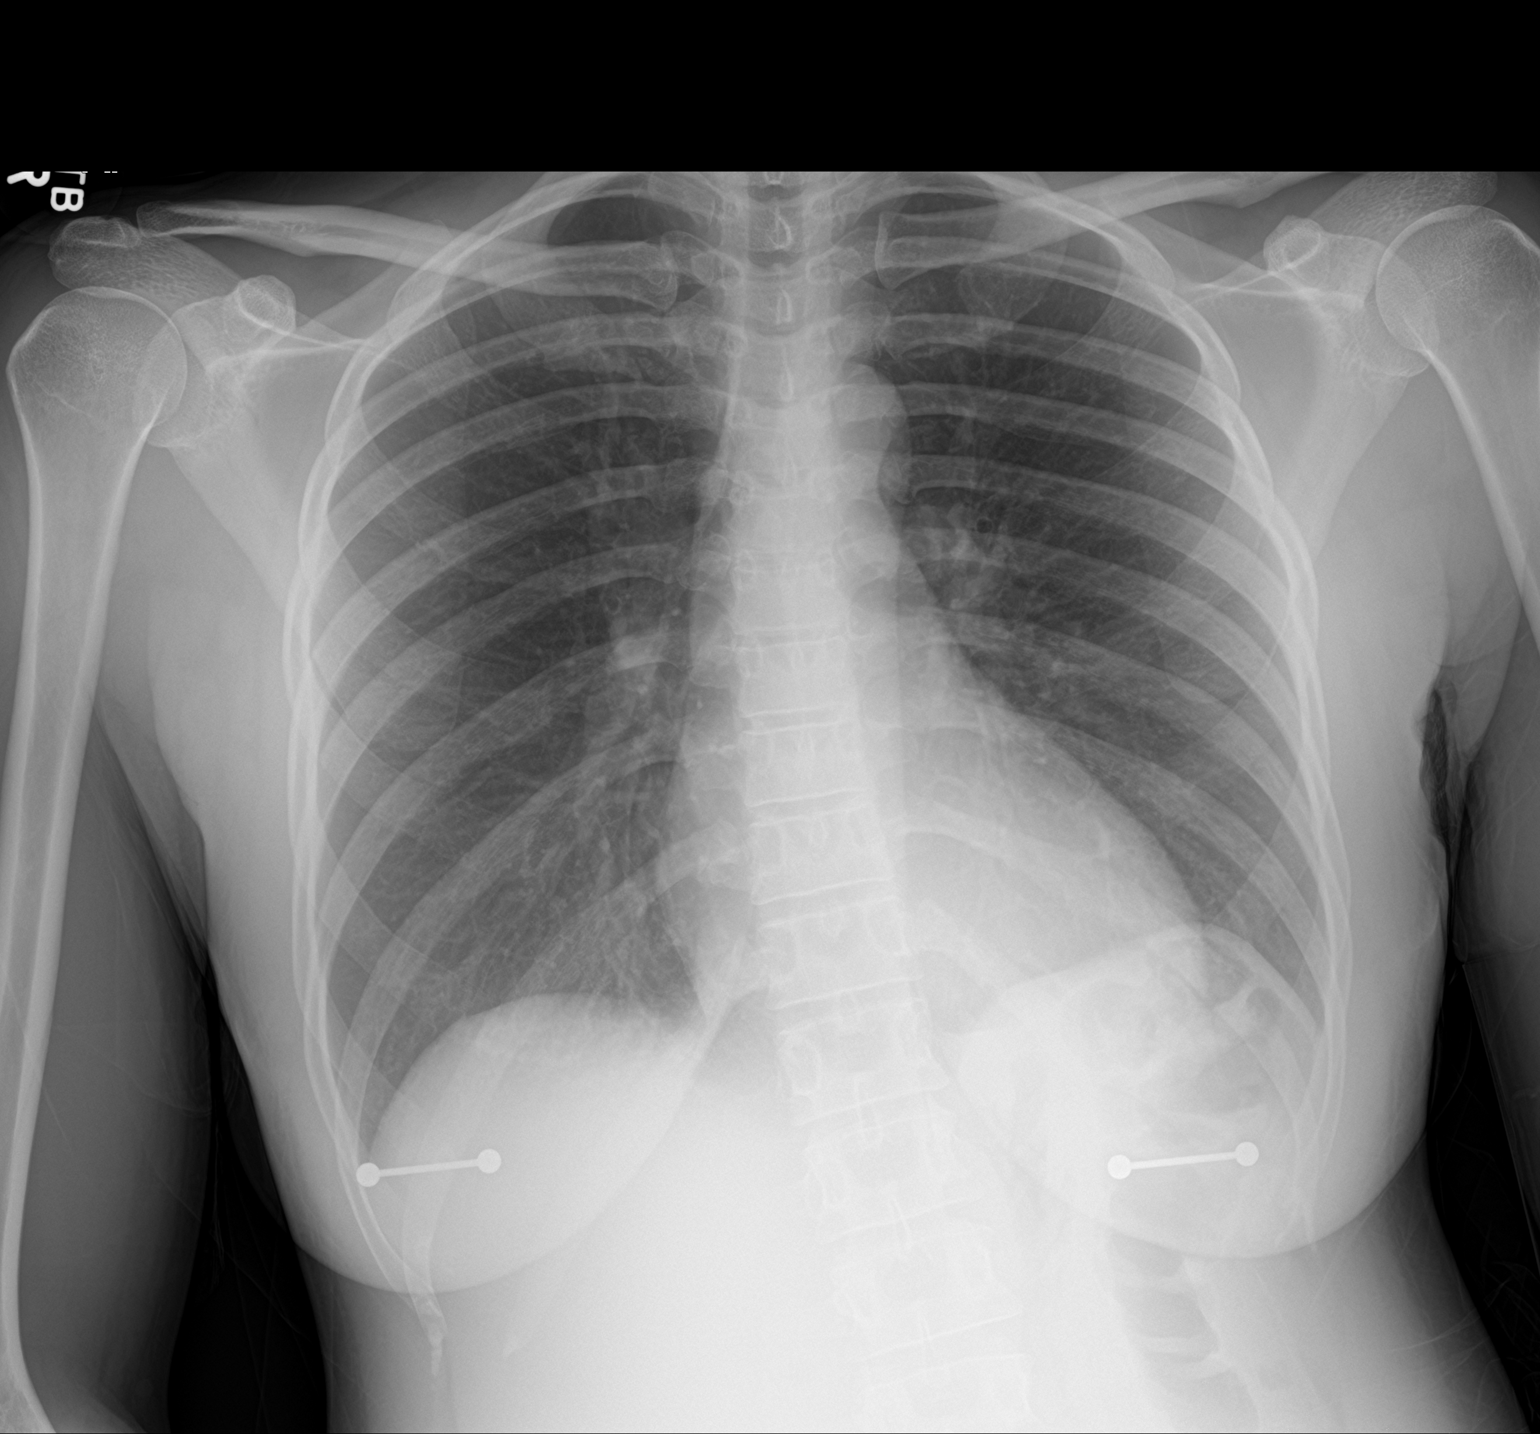

[2 of 2 positions shown; findings below may reference images not displayed]

FINDINGS: The heart size and mediastinal contours are within normal limits.
Both lungs are clear. The visualized skeletal structures are
unremarkable.
IMPRESSION: No active cardiopulmonary disease.

## 2023-12-20 ENCOUNTER — Other Ambulatory Visit: Payer: Self-pay | Admitting: Medical Genetics

## 2024-01-15 ENCOUNTER — Other Ambulatory Visit: Payer: Self-pay

## 2024-03-27 ENCOUNTER — Other Ambulatory Visit: Payer: Self-pay | Admitting: Medical Genetics

## 2024-03-27 DIAGNOSIS — Z006 Encounter for examination for normal comparison and control in clinical research program: Secondary | ICD-10-CM
